# Patient Record
Sex: Female | Born: 1975 | Marital: Single | Smoking: Never smoker
Health system: Southern US, Community
[De-identification: ages and names within clinical notes are randomized; demographics above are authoritative.]

## PROBLEM LIST (undated history)

## (undated) ENCOUNTER — Ambulatory Visit (HOSPITAL_COMMUNITY): Discharge status: Absent without leave | Payer: Self-pay

## (undated) DIAGNOSIS — O039 Complete or unspecified spontaneous abortion without complication: Secondary | ICD-10-CM

## (undated) DIAGNOSIS — I10 Essential (primary) hypertension: Secondary | ICD-10-CM

## (undated) DIAGNOSIS — R569 Unspecified convulsions: Secondary | ICD-10-CM

## (undated) DIAGNOSIS — S42301A Unspecified fracture of shaft of humerus, right arm, initial encounter for closed fracture: Secondary | ICD-10-CM

## (undated) DIAGNOSIS — L0291 Cutaneous abscess, unspecified: Secondary | ICD-10-CM

---

## 2004-07-27 ENCOUNTER — Ambulatory Visit: Payer: Self-pay | Admitting: Internal Medicine

## 2004-09-13 ENCOUNTER — Ambulatory Visit: Payer: Self-pay | Admitting: Internal Medicine

## 2004-09-16 ENCOUNTER — Ambulatory Visit: Payer: Self-pay | Admitting: *Deleted

## 2004-11-11 ENCOUNTER — Ambulatory Visit: Payer: Self-pay | Admitting: Internal Medicine

## 2004-11-11 LAB — CONVERTED CEMR LAB
Cholesterol: 226 mg/dL
HDL: 46 mg/dL
LDL Cholesterol: 140 mg/dL
Pap Smear: NORMAL
Triglycerides: 199 mg/dL

## 2005-05-27 ENCOUNTER — Ambulatory Visit: Payer: Self-pay | Admitting: Internal Medicine

## 2005-07-15 ENCOUNTER — Ambulatory Visit: Payer: Self-pay | Admitting: Internal Medicine

## 2005-09-20 ENCOUNTER — Ambulatory Visit: Payer: Self-pay | Admitting: Family Medicine

## 2006-01-05 ENCOUNTER — Ambulatory Visit: Payer: Self-pay | Admitting: Family Medicine

## 2006-07-13 ENCOUNTER — Ambulatory Visit: Payer: Self-pay | Admitting: Family Medicine

## 2006-08-11 ENCOUNTER — Ambulatory Visit: Payer: Self-pay | Admitting: Internal Medicine

## 2006-10-19 ENCOUNTER — Ambulatory Visit: Payer: Self-pay | Admitting: Internal Medicine

## 2006-10-25 ENCOUNTER — Ambulatory Visit (HOSPITAL_COMMUNITY): Admission: RE | Admit: 2006-10-25 | Discharge: 2006-10-25 | Payer: Self-pay | Admitting: Internal Medicine

## 2006-11-02 ENCOUNTER — Encounter: Payer: Self-pay | Admitting: Internal Medicine

## 2006-11-02 DIAGNOSIS — J309 Allergic rhinitis, unspecified: Secondary | ICD-10-CM | POA: Insufficient documentation

## 2006-11-13 ENCOUNTER — Telehealth (INDEPENDENT_AMBULATORY_CARE_PROVIDER_SITE_OTHER): Payer: Self-pay | Admitting: Internal Medicine

## 2006-11-17 ENCOUNTER — Ambulatory Visit: Payer: Self-pay | Admitting: Internal Medicine

## 2006-12-01 ENCOUNTER — Ambulatory Visit (HOSPITAL_COMMUNITY): Admission: RE | Admit: 2006-12-01 | Discharge: 2006-12-01 | Payer: Self-pay | Admitting: Family Medicine

## 2006-12-06 ENCOUNTER — Encounter (INDEPENDENT_AMBULATORY_CARE_PROVIDER_SITE_OTHER): Payer: Self-pay | Admitting: *Deleted

## 2006-12-12 ENCOUNTER — Telehealth (INDEPENDENT_AMBULATORY_CARE_PROVIDER_SITE_OTHER): Payer: Self-pay | Admitting: *Deleted

## 2006-12-28 ENCOUNTER — Ambulatory Visit: Payer: Self-pay | Admitting: Internal Medicine

## 2006-12-28 DIAGNOSIS — R109 Unspecified abdominal pain: Secondary | ICD-10-CM | POA: Insufficient documentation

## 2006-12-28 DIAGNOSIS — N9489 Other specified conditions associated with female genital organs and menstrual cycle: Secondary | ICD-10-CM | POA: Insufficient documentation

## 2006-12-28 LAB — CONVERTED CEMR LAB: Rapid Strep: NEGATIVE

## 2006-12-29 ENCOUNTER — Encounter (INDEPENDENT_AMBULATORY_CARE_PROVIDER_SITE_OTHER): Payer: Self-pay | Admitting: Internal Medicine

## 2007-04-03 ENCOUNTER — Ambulatory Visit: Payer: Self-pay | Admitting: Nurse Practitioner

## 2007-04-03 LAB — CONVERTED CEMR LAB
Beta hcg, urine, semiquantitative: NEGATIVE
Glucose, Urine, Semiquant: NEGATIVE
Nitrite: POSITIVE
Protein, U semiquant: >=300
Specific Gravity, Urine: 1.015
Urobilinogen, UA: 1
hCG, Beta Chain, Quant, S: <2 milliintl units/mL
pH: 6.5

## 2007-04-06 ENCOUNTER — Ambulatory Visit: Payer: Self-pay | Admitting: Internal Medicine

## 2007-04-06 DIAGNOSIS — N3090 Cystitis, unspecified without hematuria: Secondary | ICD-10-CM | POA: Insufficient documentation

## 2007-04-06 DIAGNOSIS — D259 Leiomyoma of uterus, unspecified: Secondary | ICD-10-CM | POA: Insufficient documentation

## 2007-04-09 ENCOUNTER — Encounter (INDEPENDENT_AMBULATORY_CARE_PROVIDER_SITE_OTHER): Payer: Self-pay | Admitting: Nurse Practitioner

## 2007-04-17 ENCOUNTER — Telehealth (INDEPENDENT_AMBULATORY_CARE_PROVIDER_SITE_OTHER): Payer: Self-pay | Admitting: Internal Medicine

## 2007-10-01 ENCOUNTER — Telehealth (INDEPENDENT_AMBULATORY_CARE_PROVIDER_SITE_OTHER): Payer: Self-pay | Admitting: Internal Medicine

## 2008-04-08 ENCOUNTER — Ambulatory Visit: Payer: Self-pay | Admitting: Internal Medicine

## 2008-04-08 DIAGNOSIS — R5381 Other malaise: Secondary | ICD-10-CM | POA: Insufficient documentation

## 2008-04-08 DIAGNOSIS — M542 Cervicalgia: Secondary | ICD-10-CM | POA: Insufficient documentation

## 2008-04-08 DIAGNOSIS — R5383 Other fatigue: Secondary | ICD-10-CM

## 2008-04-08 DIAGNOSIS — R1314 Dysphagia, pharyngoesophageal phase: Secondary | ICD-10-CM | POA: Insufficient documentation

## 2008-04-11 ENCOUNTER — Ambulatory Visit (HOSPITAL_COMMUNITY): Admission: RE | Admit: 2008-04-11 | Discharge: 2008-04-11 | Payer: Self-pay | Admitting: Internal Medicine

## 2008-04-16 DIAGNOSIS — R7309 Other abnormal glucose: Secondary | ICD-10-CM | POA: Insufficient documentation

## 2008-04-16 LAB — CONVERTED CEMR LAB
ALT: 65 units/L — ABNORMAL HIGH (ref 0–35)
AST: 31 units/L (ref 0–37)
Albumin: 4.4 g/dL (ref 3.5–5.2)
Alkaline Phosphatase: 68 units/L (ref 39–117)
BUN: 10 mg/dL (ref 6–23)
Basophils Absolute: 0 10*3/uL (ref 0.0–0.1)
Basophils Relative: 0 % (ref 0–1)
CO2: 27 meq/L (ref 19–32)
Calcium: 9.2 mg/dL (ref 8.4–10.5)
Chloride: 104 meq/L (ref 96–112)
Creatinine, Ser: 0.54 mg/dL (ref 0.40–1.20)
Eosinophils Absolute: 0.2 10*3/uL (ref 0.0–0.7)
Eosinophils Relative: 2 % (ref 0–5)
Glucose, Bld: 138 mg/dL — ABNORMAL HIGH (ref 70–99)
HCT: 41.8 % (ref 36.0–46.0)
Hemoglobin: 13.9 g/dL (ref 12.0–15.0)
Lymphocytes Relative: 51 % — ABNORMAL HIGH (ref 12–46)
Lymphs Abs: 4.4 10*3/uL — ABNORMAL HIGH (ref 0.7–4.0)
MCHC: 33.3 g/dL (ref 30.0–36.0)
MCV: 89.1 fL (ref 78.0–100.0)
Monocytes Absolute: 0.4 10*3/uL (ref 0.1–1.0)
Monocytes Relative: 5 % (ref 3–12)
Neutro Abs: 3.7 10*3/uL (ref 1.7–7.7)
Neutrophils Relative %: 42 % — ABNORMAL LOW (ref 43–77)
Platelets: 271 10*3/uL (ref 150–400)
Potassium: 4.3 meq/L (ref 3.5–5.3)
RBC: 4.69 M/uL (ref 3.87–5.11)
RDW: 12.9 % (ref 11.5–15.5)
Sodium: 141 meq/L (ref 135–145)
TSH: 4.422 microintl units/mL (ref 0.350–4.50)
Total Bilirubin: 0.3 mg/dL (ref 0.3–1.2)
Total Protein: 7.5 g/dL (ref 6.0–8.3)
WBC: 8.7 10*3/uL (ref 4.0–10.5)

## 2008-04-24 ENCOUNTER — Ambulatory Visit: Payer: Self-pay | Admitting: Internal Medicine

## 2008-04-24 LAB — CONVERTED CEMR LAB: Blood Glucose, AC Bkfst: 119 mg/dL

## 2008-05-05 LAB — CONVERTED CEMR LAB
HCV Ab: NEGATIVE
Hep A Total Ab: POSITIVE — AB
Hep B Core Total Ab: NEGATIVE
Hep B E Ab: NEGATIVE
Hep B S Ab: NEGATIVE
Hepatitis B Surface Ag: NEGATIVE

## 2008-05-08 ENCOUNTER — Ambulatory Visit: Payer: Self-pay | Admitting: Internal Medicine

## 2008-05-19 LAB — CONVERTED CEMR LAB: Hep A IgM: NEGATIVE

## 2008-06-05 ENCOUNTER — Ambulatory Visit: Payer: Self-pay | Admitting: Internal Medicine

## 2008-06-05 DIAGNOSIS — G5600 Carpal tunnel syndrome, unspecified upper limb: Secondary | ICD-10-CM | POA: Insufficient documentation

## 2008-06-05 DIAGNOSIS — E669 Obesity, unspecified: Secondary | ICD-10-CM | POA: Insufficient documentation

## 2008-06-06 ENCOUNTER — Encounter (INDEPENDENT_AMBULATORY_CARE_PROVIDER_SITE_OTHER): Payer: Self-pay | Admitting: Internal Medicine

## 2008-06-13 ENCOUNTER — Ambulatory Visit (HOSPITAL_COMMUNITY): Admission: RE | Admit: 2008-06-13 | Discharge: 2008-06-13 | Payer: Self-pay | Admitting: Internal Medicine

## 2008-06-17 ENCOUNTER — Encounter (INDEPENDENT_AMBULATORY_CARE_PROVIDER_SITE_OTHER): Payer: Self-pay | Admitting: Internal Medicine

## 2008-06-18 LAB — CONVERTED CEMR LAB
ALT: 41 units/L — ABNORMAL HIGH (ref 0–35)
AST: 26 units/L (ref 0–37)
Albumin: 4.6 g/dL (ref 3.5–5.2)
Alkaline Phosphatase: 68 units/L (ref 39–117)
Bilirubin, Direct: <0.1 mg/dL (ref 0.0–0.3)
TSH: 4.485 microintl units/mL (ref 0.350–4.500)
Total Bilirubin: 0.3 mg/dL (ref 0.3–1.2)
Total Protein: 7.5 g/dL (ref 6.0–8.3)

## 2008-08-07 ENCOUNTER — Ambulatory Visit: Payer: Self-pay | Admitting: Internal Medicine

## 2008-08-19 LAB — CONVERTED CEMR LAB
ALT: 44 units/L — ABNORMAL HIGH (ref 0–35)
AST: 26 units/L (ref 0–37)
Albumin: 4.3 g/dL (ref 3.5–5.2)
Alkaline Phosphatase: 64 units/L (ref 39–117)
Bilirubin, Direct: 0.1 mg/dL (ref 0.0–0.3)
Indirect Bilirubin: 0.2 mg/dL (ref 0.0–0.9)
Total Bilirubin: 0.3 mg/dL (ref 0.3–1.2)
Total Protein: 7.8 g/dL (ref 6.0–8.3)

## 2008-12-12 ENCOUNTER — Emergency Department (HOSPITAL_COMMUNITY): Admission: EM | Admit: 2008-12-12 | Discharge: 2008-12-12 | Payer: Self-pay | Admitting: Emergency Medicine

## 2008-12-12 ENCOUNTER — Telehealth (INDEPENDENT_AMBULATORY_CARE_PROVIDER_SITE_OTHER): Payer: Self-pay | Admitting: Internal Medicine

## 2009-01-15 ENCOUNTER — Encounter (INDEPENDENT_AMBULATORY_CARE_PROVIDER_SITE_OTHER): Payer: Self-pay | Admitting: Internal Medicine

## 2009-01-15 ENCOUNTER — Ambulatory Visit: Payer: Self-pay | Admitting: Internal Medicine

## 2009-01-15 DIAGNOSIS — N200 Calculus of kidney: Secondary | ICD-10-CM | POA: Insufficient documentation

## 2009-01-15 DIAGNOSIS — N915 Oligomenorrhea, unspecified: Secondary | ICD-10-CM | POA: Insufficient documentation

## 2009-01-15 DIAGNOSIS — E7800 Pure hypercholesterolemia, unspecified: Secondary | ICD-10-CM | POA: Insufficient documentation

## 2009-01-15 DIAGNOSIS — Q828 Other specified congenital malformations of skin: Secondary | ICD-10-CM | POA: Insufficient documentation

## 2009-01-15 LAB — CONVERTED CEMR LAB
Bilirubin Urine: NEGATIVE
Blood in Urine, dipstick: NEGATIVE
Chlamydia, DNA Probe: NEGATIVE
GC Probe Amp, Genital: NEGATIVE
Glucose, Urine, Semiquant: NEGATIVE
KOH Prep: NEGATIVE
Ketones, urine, test strip: NEGATIVE
Nitrite: NEGATIVE
Protein, U semiquant: NEGATIVE
Specific Gravity, Urine: 1.01
Urobilinogen, UA: NEGATIVE
WBC Urine, dipstick: NEGATIVE
Whiff Test: POSITIVE
pH: 7

## 2009-01-16 ENCOUNTER — Ambulatory Visit: Payer: Self-pay | Admitting: Internal Medicine

## 2009-01-16 LAB — CONVERTED CEMR LAB: Rapid HIV Screen: NEGATIVE

## 2009-01-22 ENCOUNTER — Encounter (INDEPENDENT_AMBULATORY_CARE_PROVIDER_SITE_OTHER): Payer: Self-pay | Admitting: Internal Medicine

## 2009-01-22 DIAGNOSIS — R8789 Other abnormal findings in specimens from female genital organs: Secondary | ICD-10-CM | POA: Insufficient documentation

## 2009-01-23 LAB — CONVERTED CEMR LAB
ALT: 85 units/L — ABNORMAL HIGH (ref 0–35)
AST: 44 units/L — ABNORMAL HIGH (ref 0–37)
Albumin: 4.3 g/dL (ref 3.5–5.2)
Alkaline Phosphatase: 62 units/L (ref 39–117)
BUN: 10 mg/dL (ref 6–23)
Basophils Absolute: 0 10*3/uL (ref 0.0–0.1)
Basophils Relative: 0 % (ref 0–1)
CO2: 19 meq/L (ref 19–32)
Calcium: 8.9 mg/dL (ref 8.4–10.5)
Chloride: 104 meq/L (ref 96–112)
Cholesterol: 225 mg/dL — ABNORMAL HIGH (ref 0–200)
Creatinine, Ser: 0.53 mg/dL (ref 0.40–1.20)
DHEA-SO4: 110 ug/dL (ref 35–430)
Eosinophils Absolute: 0.1 10*3/uL (ref 0.0–0.7)
Eosinophils Relative: 1 % (ref 0–5)
Glucose, Bld: 106 mg/dL — ABNORMAL HIGH (ref 70–99)
HCT: 43.7 % (ref 36.0–46.0)
HDL: 42 mg/dL (ref >39)
Hemoglobin: 13.8 g/dL (ref 12.0–15.0)
LDL Cholesterol: 141 mg/dL — ABNORMAL HIGH (ref 0–99)
Lymphocytes Relative: 32 % (ref 12–46)
Lymphs Abs: 2.9 10*3/uL (ref 0.7–4.0)
MCHC: 31.6 g/dL (ref 30.0–36.0)
MCV: 91.4 fL (ref 78.0–100.0)
Monocytes Absolute: 0.5 10*3/uL (ref 0.1–1.0)
Monocytes Relative: 6 % (ref 3–12)
Neutro Abs: 5.5 10*3/uL (ref 1.7–7.7)
Neutrophils Relative %: 61 % (ref 43–77)
Platelets: 255 10*3/uL (ref 150–400)
Potassium: 4.7 meq/L (ref 3.5–5.3)
RBC: 4.78 M/uL (ref 3.87–5.11)
RDW: 13.1 % (ref 11.5–15.5)
Sodium: 138 meq/L (ref 135–145)
TSH: 6.129 microintl units/mL — ABNORMAL HIGH (ref 0.350–4.500)
Testosterone: 27.24 ng/dL (ref 10–70)
Total Bilirubin: 0.5 mg/dL (ref 0.3–1.2)
Total CHOL/HDL Ratio: 5.4
Total Protein: 7.6 g/dL (ref 6.0–8.3)
Triglycerides: 210 mg/dL — ABNORMAL HIGH (ref <150)
VLDL: 42 mg/dL — ABNORMAL HIGH (ref 0–40)
WBC: 9 10*3/uL (ref 4.0–10.5)

## 2009-02-20 ENCOUNTER — Ambulatory Visit: Payer: Self-pay | Admitting: Internal Medicine

## 2009-02-25 ENCOUNTER — Encounter (INDEPENDENT_AMBULATORY_CARE_PROVIDER_SITE_OTHER): Payer: Self-pay | Admitting: Internal Medicine

## 2009-03-25 ENCOUNTER — Ambulatory Visit: Payer: Self-pay | Admitting: Family Medicine

## 2009-03-25 ENCOUNTER — Other Ambulatory Visit: Admission: RE | Admit: 2009-03-25 | Discharge: 2009-03-25 | Payer: Self-pay | Admitting: Family Medicine

## 2009-04-29 ENCOUNTER — Ambulatory Visit: Payer: Self-pay | Admitting: Obstetrics & Gynecology

## 2010-04-11 ENCOUNTER — Encounter: Payer: Self-pay | Admitting: Internal Medicine

## 2010-04-20 NOTE — Progress Notes (Signed)
Summary: TEST RESULTS/  Phone Note Call from Patient Call back at Home Phone [phone]   Caller: Patient Reason for Call: Lab or Test Results Summary of Call: THE PT IS REQUESTING TEST RESULTS/ PT WANTS TO COME TOMORROW MORNING Initial call taken by: Manon HildingGraciela Kellar,  November 13, 2006 4:33 PM  Follow-up for Phone Call        Ashby DawesGraciela to call pt to make appt for f/u on xray results Follow-up by: Vesta Mixeriffany McCoy,  November 13, 2006 5:01 PM

## 2010-04-20 NOTE — Letter (Signed)
Summary: *Referral Letter  [facility]  [address]   [place], [place] [address]   Phone: [phone]  Fax: [phone]    01/22/2009  Thank you in advance for agreeing to see my patient:  [facility] [address] [place], [place]  [address]  Phone: [phone]  Reason for Referral: LGSIL on recent pap smear with grossly abnormal appearance of cervix on exam--almost appeared as a strawberry cervix--scattered small areas of inflammation  Procedures Requested:   Current Medical Problems: 1)  KERATOSIS PILARIS (ICD-757.39) 2)  HYPERCHOLESTEROLEMIA (ICD-272.0) 3)  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) 4)  ROUTINE GYNECOLOGICAL EXAMINATION (ICD-V72.31) 5)  BACTERIAL VAGINOSIS (ICD-616.10) 6)  RENAL CALCULUS, LEFT (ICD-592.0) 7)  CARPAL TUNNEL SYNDROME, RIGHT (ICD-354.0) 8)  OBESITY (ICD-278.00) 9)  ELEVATED LIVER ENZYMES (ICD-790.5) 10)  HYPERGLYCEMIA (ICD-790.29) 11)  FATIGUE (ICD-780.79) 12)  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0) 3)  OBESITY (ICD-278.00) 4)  ELEVATED LIVER ENZYMES (ICD-790.5) 5)  HYPERGLYCEMIA (ICD-790.29) 6)  FATIGUE (ICD-780.79) 7)  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  Sincerely,  Julieanne MansonElizabeth Choice Kleinsasser MD

## 2010-04-20 NOTE — Letter (Signed)
Summary: REFERRAL/RADIOLOGY/APPT DATE &TIME  REFERRAL/RADIOLOGY/APPT DATE &TIME   Imported By: Arta BruceShelia Stanislawscyk 06/17/2008 10:00:10  _____________________________________________________________________  External Attachment:    Type:   Image     Comment:   External Document

## 2010-04-20 NOTE — Assessment & Plan Note (Signed)
Summary: Cystitis/Fibroid    Vital Signs:  Patient Profile:   35 Years Old Female Weight:      165 pounds Temp:     97.9 degrees F Pulse rate:   76 / minute Pulse rhythm:   regular Resp:     18 per minute BP sitting:   122 / 80  (left arm) Cuff size:   regular  Pt. in pain?   no  Vitals Entered By: Vesta Mixeriffany McCoy CMA (April 06, 2007 8:23 AM)                  Chief Complaint:  f/u for test results.  History of Present Illness:  pt into the office for follow up from earlier this week. Her cycle ended on 03/21/07.  Noted the cycle returned 8 days ago.   Notes that flow was very heavy when it came on so soon.  She notes that she has lots of pain in her abd and back.  She notes that she does not normally have normal cycles.  However this time it was really close together. Reviewed wtih pt that she did have an ultrasound done back in september that indicated that she needed a repeat ultrasound in 6 weeks.  Pt notes that she did not have a car so she was not able to go to f/u appt. She notes that she still has pain in left side that is especially painful when her cycle starts.   She did get the cipro as ordered on her nurse visit here in office and reports that abd pain has improved.  Current Allergies: No known allergies      Review of Systems  General      Denies chills, fatigue, and fever.  ENT      Denies earache and sore throat.  CV      Denies chest pain or discomfort, fatigue, and shortness of breath with exertion.  Resp      Denies cough and shortness of breath.  GI      Complains of abdominal pain.      Denies nausea and vomiting.      improving  GU      Denies abnormal vaginal bleeding.   Physical Exam  General:     alert.   Head:     normocephalic.   Eyes:     pupils round.   Ears:     R ear normal and L ear normal.   Nose:     no external deformity.   Mouth:     fair dentition.   Lungs:     normal respiratory effort, no intercostal  retractions, no accessory muscle use, and normal breath sounds.   Heart:     normal rate, regular rhythm, and no murmur.   Abdomen:     obese nontender at this time Bs x 4 Msk:     up to exam table Extremities:     no edema Neurologic:     alert & oriented X3.   Skin:     color normal.   Psych:     Oriented X3.      Impression & Recommendations:  Problem # 1:  CYSTITIS (ICD-595.9) Pt to complete the cipro as ordered earlier this week Her updated medication list for this problem includes:    Cipro 500 Mg Tabs (Ciprofloxacin hcl) .Marland Kitchen.... 1 tablet by mouth two times a day for infection   Problem # 2:  FIBROIDS, UTERUS (ICD-218.9) Explained to pt  that she does have this dx.  Again reviewed that this is probably why her cycles are irregular and flow is heavy Orders: Ultrasound (Ultrasound)   Problem # 3:  OVARIAN MASS (ICD-625.8) Pt was to have repeat ultrasound to evaluate mass 6 weeks after original done 12-01-06.  Will ordere today. Orders: Ultrasound (Ultrasound)   Complete Medication List: 1)  Allegra 180 Mg Tabs (Fexofenadine hcl) .Marland Kitchen.. 1 by mouth once daily 2)  Nasonex 50 Mcg/act Susp (Mometasone furoate) .... 2 sprays each nostril daily 3)  Singulair 10 Mg Tabs (Montelukast sodium) .Marland Kitchen.. 1 tab by mouth daily 4)  Cipro 500 Mg Tabs (Ciprofloxacin hcl) .Marland Kitchen.. 1 tablet by mouth two times a day for infection   Patient Instructions: 1)  Get ultrasound as ordered to evaluate fibroids and right ovarian mass. 2)  Follow up in this office 2 weeks after ultrasound for results.    ]

## 2010-04-20 NOTE — Letter (Signed)
Summary: REFERRAL/ RADIOLOGY/APPT TIME  REFERRAL/ RADIOLOGY/APPT TIME   Imported By: Arta BruceShelia Stanislawscyk 04/08/2008 15:45:57  _____________________________________________________________________  External Attachment:    Type:   Image     Comment:   External Document

## 2010-04-20 NOTE — Assessment & Plan Note (Signed)
Summary: cpp exam//gk   Vital Signs:  Patient profile:   35 year old female LMP:     11/02/2008 Weight:      164 pounds Temp:     98.1 degrees F Pulse rate:   80 / minute Pulse rhythm:   regular Resp:     18 per minute BP sitting:   121 / 84  (left arm) Cuff size:   regular  Vitals Entered By: Vesta Mixer CMA (January 15, 2009 2:39 PM) CC: CPP Is Patient Diabetic? No Pain Assessment Patient in pain? no       Does patient need assistance? Ambulation Normal LMP (date): 11/02/2008 LMP - Character: 03/21/07 then came back on 03/24/07     Enter LMP: 11/02/2008 Last PAP Result Normal   CC:  CPP.  History of Present Illness: 35 yo female here for CPP.  Concerns:  1.  Kidney stone:  was in ED 12/12/08 per pt. for this.  Has not had the pain since just after the ED visit.  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  2001:  Csection  Family History: Mother, 52:  GERD,  Father, 76:  Hypertension 4 Sisters:  older sister with migraines--rest healthy 1 daughter, 44: asthma  Social History: Originally from [place]  In [employer]. for 6 years. 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  Fatigued with how much  she works.  Does not feel depressed.. Eyes:  Denies blurring. ENT:  Denies decreased hearing. CV:  Denies chest pain or discomfort and palpitations. Resp:  Denies shortness of breath. GI:  Denies bloody stools, constipation, dark tarry stools, and diarrhea; When eats a lot--abdominal pain. GU:  periods never regular has not had a period in 2-3 months. MS:  Denies joint pain, joint redness, and joint swelling. Derm:  Denies lesion(s); papules on upper arms. Neuro:  Denies numbness, tingling, and weakness. Psych:  Denies anxiety and depression.  Physical Exam  General:  Well-developed,well-nourished,in no acute distress; alert,appropriate and cooperative throughout examination Head:  Normocephalic and atraumatic without obvious abnormalities. No apparent alopecia or balding. Eyes:  No corneal or conjunctival inflammation noted. EOMI. Perrla. Funduscopic exam benign, without hemorrhages, exudates or papilledema. Vision grossly normal. Ears:  External ear exam shows no significant lesions or deformities.  Otoscopic examination reveals clear canals, tympanic membranes are intact bilaterally without bulging, retraction, inflammation or discharge. Hearing is grossly normal bilaterally. Nose:  External nasal examination shows no deformity or inflammation. Nasal mucosa are pink and moist without lesions or exudates. Mouth:  Oral mucosa and oropharynx without lesions or exudates.  Teeth in good repair. Neck:  No deformities, masses, or tenderness noted. Breasts:  No mass, nodules, thickening, tenderness, bulging, retraction, inflamation, nipple discharge or skin changes noted.   Lungs:  Normal respiratory effort, chest expands symmetrically. Lungs are clear to auscultation, no crackles or wheezes. Heart:  Normal rate and  regular rhythm. S1 and S2 normal without gallop, murmur, click, rub or other extra sounds. Abdomen:  Bowel sounds positive,abdomen soft and non-tender without masses, organomegaly or  hernias noted. Rectal:  deferred Genitalia:  Pelvic Exam:        External: normal female genitalia without lesions or masses        Vagina: normal without lesions or masses        Cervix: almost strawberry appearing cervix with scattered inflammation        Adnexa: normal bimanual exam without masses or fullness        Uterus: normal by palpation        Pap smear: performed Msk:  No deformity or scoliosis noted of thoracic or lumbar spine.   Pulses:  R and L carotid,radial,femoral,dorsalis pedis and posterior tibial pulses are full and equal bilaterally Extremities:  No clubbing, cyanosis, edema, or deformity noted with normal full range of motion of all joints.   Neurologic:  No cranial nerve deficits noted. Station and gait are normal. Plantar reflexes are down-going bilaterally. DTRs are symmetrical throughout. Sensory, motor and coordinative functions appear intact. Skin:  small papular lesions most pronounced on upper lateral arms--some on erythematous base Cervical Nodes:  No lymphadenopathy noted Axillary Nodes:  No palpable lymphadenopathy Inguinal Nodes:  No significant adenopathy Psych:  Cognition and judgment appear intact. Alert and cooperative with normal attention span and concentration. No apparent delusions, illusions, hallucinations   Impression & Recommendations:  Problem # 1:  ROUTINE GYNECOLOGICAL EXAMINATION (ICD-V72.31)  Orders: Pap Smear, Thin Prep ( Collection of) [phone]) KOH/ WET Mount [phone]) T- GC Chlamydia (56213) T-Pap Smear, Thin Prep (08657)  Problem # 2:  KERATOSIS PILARIS (ICD-757.39) Eucerin cream  Problem # 3:  BACTERIAL VAGINOSIS (ICD-616.10) Await pap smear-no trichomonas on wet prep-but still concerned with appearance of cervix. Her updated medication list for this problem includes:    Metronidazole 500 Mg Tabs (Metronidazole) .Marland Kitchen... 1 tab by mouth two times a day for 7 days  Problem # 4:  Preventive Health Care (ICD-V70.0) Tdap today Flu  shot.  Problem # 5:  RENAL CALCULUS, LEFT (ICD-592.0) Check ED chart  Complete Medication List: 1)  Singulair 10 Mg Tabs (Montelukast sodium) .Marland Kitchen.. 1 tab by mouth daily 2)  Claritin 10 Mg Tabs (Loratadine) .... 1/2 tab by mouth in morning 3)  Aciphex 20 Mg Tbec (Rabeprazole sodium) .Marland Kitchen.. 1 tab by mouth on empty stomach in morning 4)  Metronidazole 500 Mg Tabs (Metronidazole) .Marland Kitchen.. 1 tab by mouth two times a day for 7 days  Other Orders: Nutrition Referral (Nutrition) Tdap => 72yrs IM (84696) Admin 1st Vaccine (29528) Admin 1st Vaccine [facility]) [phone]) Flu Vaccine 43yrs + (01027) Admin of Any Addtl Vaccine (25366) Admin of Any Addtl Vaccine (State) (44034V)  Patient Instructions: 1)  Eucerin cream for arms after shower everyday. 2)  Fasting labs tomorrow if possible--FLP, CBC, CmET, RPR, HIV, testosterone, DHEAS, TSH--272.0, fatigue, v72.3, oligomenorrhea 3)  Calcium 500-600 mg two times a day with Vitamin D 200 International Units two times a day --can get both in one pill 4)  Exercise!!!  Preventive Care Screening  Prior Values:    Pap Smear:  Normal (11/11/2004)     SBE:  No findings--does perform at least monthly. Mammogram:  Never. Osteoprevention:  Not much in way of dairy.  No exercise. Immunizations:  has not had Td in some time.  No flu or pneumovax in past.  Prescriptions: METRONIDAZOLE 500 MG TABS (METRONIDAZOLE) 1 tab by  mouth two times a day for 7 days  #14 x 0   Entered and Authorized by:   Julieanne MansonElizabeth Conchita Truxillo MD   Signed by:   Julieanne MansonElizabeth Dynastie Knoop MD on 01/15/2009   Method used:   Faxed to ...       [facility] (retail)       [address]       [place], [place]  [address]       Ph: [phone]       Fax: [phone]   RxID:   [id number]    Tetanus/Td Vaccine    Vaccine Type: Tdap    Site: right deltoid    Mfr: Sanofi Pasteur    Dose: 0.5 ml    Route: IM    Given by: Vesta Mixeriffany McCoy CMA    Exp. Date:  09/25/2010    Lot #: [id number]    VIS given: 02/06/07 version given January 15, 2009.  Influenza Vaccine    Vaccine Type: Fluvax 3+    Site: left deltoid    Mfr: Sanofi Pasteur    Dose: 0.5 ml    Route: IM    Given by: Vesta Mixeriffany McCoy CMA    Exp. Date: 09/17/2009    Lot #: [id number]    VIS given: 10/12/06 version given January 15, 2009.   Laboratory Results   Urine Tests    Routine Urinalysis   Glucose: negative   (Normal Range: Negative) Bilirubin: negative   (Normal Range: Negative) Ketone: negative   (Normal Range: Negative) Spec. Gravity: 1.010   (Normal Range: 1.003-1.035) Blood: negative   (Normal Range: Negative) pH: 7.0   (Normal Range: 5.0-8.0) Protein: negative   (Normal Range: Negative) Urobilinogen: negative   (Normal Range: 0-1) Nitrite: negative   (Normal Range: Negative) Leukocyte Esterace: negative   (Normal Range: Negative)      Wet Mount/KOH Source: vaginal WBC/hpf: 1-5 Bacteria/hpf: 2+ Clue cells/hpf: few  Positive whiff Yeast/hpf: none Trichomonas/hpf: none   Laboratory Results   Urine Tests    Routine Urinalysis   Glucose: negative   (Normal Range: Negative) Bilirubin: negative   (Normal Range: Negative) Ketone: negative   (Normal Range: Negative) Spec. Gravity: 1.010   (Normal Range: 1.003-1.035) Blood: negative   (Normal Range: Negative) pH: 7.0   (Normal Range: 5.0-8.0) Protein: negative   (Normal Range: Negative) Urobilinogen: negative   (Normal Range: 0-1) Nitrite: negative   (Normal Range: Negative) Leukocyte Esterace: negative   (Normal Range: Negative)      Wet Mount  Positive whiff Wet Mount KOH: Negative

## 2010-04-20 NOTE — Assessment & Plan Note (Signed)
  Medications Added CIPRO 500 MG  TABS (CIPROFLOXACIN HCL) 1 tablet by mouth two times a day for infection      Triage/Nurse Visit  Vitals Entered By: Mikey Collegeegina Poe CMA (April 03, 2007 10:55 AM)  Menstrual History: LMP - Character: 03/21/07 then came back on 03/24/07             Is Patient Diabetic? No  Does patient need assistance? Functional Status Self care Ambulation Normal Comments Triage/Nurse Visit.     Chief Complaint:  Pt c/o abd pains and back pain. Pt states that last period was 03/21/07 and started again 03/24/07(Saturday). Pt states color was red instead black looking . Left ovary pain for 2days but on yesterday there was no pain. pt worked for [employer]15hrs and only used 1 pad that day. pt stated last night went to bathroom and passed a large amount of blood and was dizzy. This was about 11:40pm after work..  Current Allergies: No known allergies         Complete Medication List: 1)  Allegra 180 Mg Tabs (Fexofenadine hcl) .Marland Kitchen... 1 by mouth once daily 2)  Nasonex 50 Mcg/act Susp (Mometasone furoate) .... 2 sprays each nostril daily 3)  Singulair 10 Mg Tabs (Montelukast sodium) .Marland Kitchen... 1 tab by mouth daily 4)  Cipro 500 Mg Tabs (Ciprofloxacin hcl) .Marland Kitchen... 1 tablet by mouth two times a day for infection   Patient Instructions: 1)  drink plenty of water 2)  schedule regular bathroom breaks while working.  do not hold your urine    Prescriptions: CIPRO 500 MG  TABS (CIPROFLOXACIN HCL) 1 tablet by mouth two times a day for infection  #10 x 0   Entered by:   Mikey Collegeegina Poe CMA   Authorized by:   Lehman PromNykedtra Martin FNP   Signed by:   Mikey Collegeegina Poe CMA on 04/03/2007   Method used:   Print then Give to Patient   RxID:   [id number]  ] Laboratory Results   Urine Tests  Date/Time Received: April 03, 2007 10:56 AM Date/Time Reported: April 03, 2007 10:56 AM  Routine Urinalysis   Color: red Appearance: Hazy Glucose: negative   (Normal Range: Negative) Bilirubin: large    (Normal Range: Negative) Ketone: smal (15)   (Normal Range: Negative) Spec. Gravity: 1.015   (Normal Range: 1.003-1.035) Blood: large   (Normal Range: Negative) pH: 6.5   (Normal Range: 5.0-8.0) Protein: >=300   (Normal Range: Negative) Urobilinogen: 1.0   (Normal Range: 0-1) Nitrite: positive   (Normal Range: Negative) Leukocyte Esterace: large   (Normal Range: Negative)    Urine HCG: negative     Appended Document: added visit number

## 2010-04-20 NOTE — Letter (Signed)
Summary: REFERR[facility]  REFERRAL[facility]   Imported By: Arta Bruce 04/13/2009 16:11:53  _____________________________________________________________________  External Attachment:    Type:   Image     Comment:   External Document
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
## 2010-04-20 NOTE — Miscellaneous (Signed)
Summary: VIP  Patient: Annette Wolfe Note: All result statuses are Final unless otherwise noted.  Tests: (1) VIP (Medications)   LLIMPORTMEDS              "Result Below..."       RESULT: NASACORT AQ AERS 55 MCG/ACT*DOS INHALACIONES EN CADA FOSA NASAL UNA VEZ AL DIA (2 SPRAYS IN EACH NOSTRIL ONCE DAILY).*07/13/2006*Last Refill: 11/22/2006*[id number]*******   LLIMPORTMEDS              "Result Below..."       RESULT: DIFLUCAN TABS 100 MG*TOMAR UNA PASTILLA Y MEDIA (150MG )*08/11/2006*Last Refill: [email or web address]*[id number]*******   LLIMPORTMEDS              "Result Below..."       RESULT: DICLOFENAC SODIUM TBEC 75 MG*TOMAR UNA PASTILLA DOS VECES AL DIA SI LO NECESITA (TAKE ONE TABLET TWICE A DAY AS NEEDED).*11/23/2006*Last Refill: [email or web address]*[id number]*******   LLIMPORTMEDS              "Result Below..."       RESULT: ALLEGRA TABS 180 MG*TOMAR UNA TABLETA DIARIA (TAKE ONE TABLET ONCE A DAY)*11/23/2006*Last Refill: [email or web address]*[id number]*******   LLIMPORTALLS              NKDA***  Note: An exclamation mark (!) indicates a result that was not dispersed into the flowsheet. Document Creation Date: 01/18/2007 3:04 PM _______________________________________________________________________  (1) Order result status: Final Collection or observation date-time: 12/06/2006 Requested date-time: 12/06/2006 Receipt date-time:  Reported date-time: 12/06/2006 Referring Physician:   Ordering Physician:   Specimen Source:  Source: [facility] Order Number:  Lab site:

## 2010-04-20 NOTE — Progress Notes (Signed)
Summary: ofice visit/  ultrasound results/  [facility] [phone]   Summary of Call: The pt wants to come for ultrasound results. Initial call taken by: Manon HildingGraciela Kellar,  December 12, 2006 12:25 PM  Follow-up for Phone Call        OK to [facility] for patient [facility] Follow-up by: Vesta Mixeriffany McCoy CMA,  December 15, 2006 11:22 AM  Additional Follow-up for Phone Call Additional follow up Details #1::        She will come on Oct 9 at 11:30 am Additional Follow-up by: Manon HildingGraciela Kellar,  December 15, 2006 2:46 PM

## 2010-04-20 NOTE — Progress Notes (Signed)
Summary: Sore Throat & headache   Phone Note Call from Patient   Caller: Patient Complaint: Cough/Sore throat Summary of Call: Pt is being having Sore Throat & Headache since 3 weeks and the next available appt that Dr Delrae AlfredMulberry has is 8-20 . Please, call her at  [phone]  . Thank You . Pt speak Spanish . Initial call taken by: Cheryll DessertNora Soler,  October 01, 2007 4:10 PM  Follow-up for Phone Call        Spoke with pt she took some ibuprofen and is feeling better.  Is having slight cough still.  I advised her to drink plenty of liquids and try Delysm otc and let us know next week if not better completly. Follow-up by: Vesta Mixeriffany McCoy CMA,  October 10, 2007 12:25 PM

## 2010-04-20 NOTE — Progress Notes (Signed)
 ----   Converted from flag ---- ---- 04/11/2007 2:25 PM, Leodis RainsKimberly Tinnin wrote: Duke SalviaBrenda Maunawili [facility] say that Ms. Soto no showed for radiology yesterday. She says maybe it was the weather. ------------------------------  Phone Note Other Incoming   Summary of Call: phone call from radiology--find out why no showed and see if can get rescheduled. Initial call taken by: Julieanne MansonElizabeth Tramel Westbrook MD,  April 17, 2007 5:22 PM  Follow-up for Phone Call        Ashby DawesGraciela, Can you check with pt to see why she missed her appt at radiology.  Thanks Follow-up by: Vesta Mixeriffany McCoy CMA,  April 18, 2007 9:40 AM  Additional Follow-up for Phone Call Additional follow up Details #1::        left message Additional Follow-up by: Manon HildingGraciela Kellar,  April 18, 2007 11:54 AM    Additional Follow-up for Phone Call Additional follow up Details #2::    The patient couldn't go to her radiology office visit because was on the snow day 05/11/07 and it was hard for her to drive around.  The patient will call me back again on Friday and let me know the best time for ther to go.  Follow-up by: Manon HildingGraciela Kellar,  April 18, 2007 4:05 PM  Additional Follow-up for Phone Call Additional follow up Details #3:: Details for Additional Follow-up Action Taken: Thanks, Will let Dr. Delrae AlfredMulberry know. Additional Follow-up by: Vesta Mixeriffany McCoy CMA,  April 19, 2007 1:09 PM

## 2010-04-20 NOTE — Miscellaneous (Signed)
Summary: Ultrasound order   Clinical Lists Changes  Orders: Added new Test order of Ultrasound (Ultrasound) - Signed

## 2010-04-20 NOTE — Letter (Signed)
Summary: REFERRAL/PHYSICAL THERAPY  REFERRAL/PHYSICAL THERAPY   Imported By: Arta BruceShelia Stanislawscyk 04/09/2008 08:48:42  _____________________________________________________________________  External Attachment:    Type:   Image     Comment:   External Document

## 2010-04-20 NOTE — Progress Notes (Signed)
Summary: ? about meds   Phone Note [facility] [phone]   Summary of Call: Early morning today, the pt went to ER because she has a terrible pain in the left side above her ovary and the provider at there prescribed her morphine and oxycodonine  for the pain.  In the ER, they found a stone in her kidney.  Pt is a little bit concern because in the past she had liver problems and she wants to make sure if these medication can affect her health.  Please call her back [phone] [facility]   Initial call taken by: Manon HildingGraciela Kellar,  December 12, 2008 3:00 PM  Follow-up for Phone Call        Will pull ED records for your review. Follow-up by: Vesta Mixeriffany McCoy CMA,  December 12, 2008 3:08 PM  Additional Follow-up for Phone Call Additional follow up Details #1::        Per Dr. Delrae AlfredMulberry ok to take meds from ED.  Pt notified. Additional Follow-up by: Vesta Mixeriffany McCoy CMA,  December 12, 2008 5:05 PM

## 2010-04-20 NOTE — Letter (Signed)
Summary: REFERRAL/NUTRITION   REFERRAL/NUTRITION   Imported By: Arta BruceShelia Stanislawscyk 06/16/2008 11:35:33  _____________________________________________________________________  External Attachment:    Type:   Image     Comment:   External Document

## 2010-04-20 NOTE — Assessment & Plan Note (Signed)
Summary: per nurse/ ultrasound results//gk   Vital Signs:  Patient Profile:   35 Years Old Female LMP:     12/28/2006 Weight:      165 pounds Temp:     98 degrees F Pulse rate:   20 / minute Pulse rhythm:   regular Resp:     88 per minute BP sitting:   122 / 78  (left arm) Cuff size:   regular  Pt. in pain?   yes    Location:   throat    Intensity:   6  Vitals Entered By: Vesta Mixer CMA (December 28, 2006 11:32 AM)  Menstrual History: LMP (date): 12/28/2006              Is Patient Diabetic? No      Chief Complaint:  sore throat and ultra sound results.  History of Present Illness: 1.  Follow up right adnexal abnormality on pelvic CT.  Pelvic ultrasound confirms an abnormality described as a nodule, but no further characterization.  Pt. does not have any pain on right--only left pelvic area with her periods.  She does also have a uterine fibroid noted on both studies.  Is only taking 2 Advil  three times daily with the pain.  No heavy menses.  2.  Sore throat:    Has been going on since July.  Has been treated with Amoxicillin for sinusitis and recently treated for allergies  8/08  Nasocort and Loratidine .  Pt. states she continues to have as bad a headache and sore throat as previous.  Does have posterior pharyngeal drainage, throat itches, no sneezing or cough.  Has this every year during warm months, then goes away during cold months.  Has had past 3 years.   Using Nasocort appropriately.  Claritin, the generic of which she is currently taking has worked in past.  Current Allergies (reviewed today): No known allergies       Physical Exam  General:     NAD Head:     Normocephalic and atraumatic without obvious abnormalities. No apparent alopecia or balding. Eyes:     No corneal or conjunctival inflammation noted. EOMI. Perrla. Funduscopic exam benign, without hemorrhages, exudates or papilledema. Vision grossly normal. Ears:     External ear exam shows no  significant lesions or deformities.  Otoscopic examination reveals clear canals, tympanic membranes are intact bilaterally without bulging, retraction, inflammation or discharge. Hearing is grossly normal bilaterally. Nose:     Nasal mucosa erythematous with swollen turbinates and clear discharge Mouth:     pharynx pink and moist.   Neck:     Shotty ant cervical nodes--mildly tender. Lungs:     Normal respiratory effort, chest expands symmetrically. Lungs are clear to auscultation, no crackles or wheezes. Heart:     Normal rate and regular rhythm. S1 and S2 normal without gallop, murmur, click, rub or other extra sounds.    Impression & Recommendations:  Problem # 1:  ALLERGIC RHINITIS (ICD-477.9) Samples of AllegraD 1 tab by mouth two times a day for 7 days Samples of Singulair 10 mg by mouth daily fo 1 month Follow up with [facility] in 1 month Her updated medication list for this problem includes:    Allegra 180 Mg Tabs (Fexofenadine hcl) .Marland Kitchen... 1 by mouth once daily    Nasonex 50 Mcg/act Susp (Mometasone furoate) .Marland Kitchen... 2 sprays each nostril daily   Problem # 2:  PELVIC PAIN, LEFT (ICD-789.09) Take ibuprofen 200 mg 3-4 tabs three  times a day with food when has pain with periods.  Problem # 3:  OVARIAN MASS (ICD-625.8) Not clear what this is. Did speak with Radiology--CT of pelvis misread in body of report as showing left adnexal cystic lesion.  Lesion actually on right. Gyn referral Orders: Gynecologic Referral (Gyn)   Complete Medication List: 1)  Allegra 180 Mg Tabs (Fexofenadine hcl) .Marland Kitchen... 1 by mouth once daily 2)  Nasonex 50 Mcg/act Susp (Mometasone furoate) .... 2 sprays each nostril daily 3)  Singulair 10 Mg Tabs (Montelukast sodium) .Marland Kitchen... 1 tab by mouth daily   Patient Instructions: 1)  Take ibuprofen(Advil) 3-4 tablets three times daily when pain with periods. 2)  You should be hearing from us about an appt for the gynecologist 3)  Call for a follow up with  [facility] in 1 month. 4)  Take one tablet of Singulair daily 5)  Take 1 tablet twice daily of Allegra D.  When you get the plain Allegra filled(Fexofenadine)  you will take just 1 tablet a day 6)  Continue the Nasocort.    Prescriptions: NASONEX 50 MCG/ACT SUSP (MOMETASONE FUROATE) 2 sprays each nostril daily  #1 x 6   Entered and Authorized by:   Julieanne MansonElizabeth Lexys Milliner MD   Signed by:   Julieanne MansonElizabeth Jeannett Dekoning MD on 12/28/2006   Method used:   Print then Give to Patient   RxID:   [id number] ALLEGRA 180 MG TABS (FEXOFENADINE HCL) 1 by mouth once daily  #30 x 6   Entered and Authorized by:   Julieanne MansonElizabeth Ulus Hazen MD   Signed by:   Julieanne MansonElizabeth Mikesha Migliaccio MD on 12/28/2006   Method used:   Print then Give to Patient   RxID:   [id number]  ] Laboratory Results  Date/Time Received: December 28, 2006 11:50 AM   Other Tests  Rapid Strep: negative

## 2010-04-20 NOTE — Assessment & Plan Note (Signed)
Summary: FOLLOW-UP///BC   Vital Signs:  Patient profile:   35 year old female LMP:     07/24/2008 Height:      59 inches Weight:      161 pounds BMI:     32.64 Temp:     97.2 degrees F Pulse rate:   92 / minute Pulse rhythm:   regular Resp:     20 per minute BP sitting:   120 / 78  (left arm) Cuff size:   regular  Vitals Entered By: Vesta Mixeriffany McCoy CMA (Aug 07, 2008 3:43 PM) CC: 2 month f/u on blood test done the last time she was here and would like a rx for clairtin. Is Patient Diabetic? No Pain Assessment Patient in pain? no       Does patient need assistance? Ambulation Normal Comments not taking clairtin because she lost the bottle LMP (date): 07/24/2008 LMP - Character: 03/21/07 then came back on 03/24/07  years   days  Enter LMP: 07/24/2008 Last PAP Result Normal   History of Present Illness: 1.  Elevated liver enzymes:  Much improved in March, but SGPT still slightly elevated.  Pt. was taking a lot of ibuprofen at one time--stopped before last check.  2.  Uterine and adnexal concerns:  Left uterine mass--fibroid that appears stable.  Right adnexal nodule resolved with U/S 3/10.  No further evaluation needed.  3.  Allergic Rhinitis:  Needs a refill of Claritin  4.  Obesity:  Has made lifestyle changes with exercise and diet--trying very hard to lose weight.  Allergies (verified): No Known Drug Allergies  Physical Exam  Lungs:  Normal respiratory effort, chest expands symmetrically. Lungs are clear to auscultation, no crackles or wheezes. Heart:  Normal rate and regular rhythm. S1 and S2 normal without gallop, murmur, click, rub or other extra sounds.   Impression & Recommendations:  Problem # 1:  OBESITY (ICD-278.00) Encouraged continued changes  Problem # 2:  ELEVATED LIVER ENZYMES (ICD-790.5)  Orders: T-Hepatic Function ([id number])  Problem # 3:  OVARIAN MASS (ICD-625.8) Resolved.  fibroid stable.  Complete Medication List: 1)  Singulair 10  Mg Tabs (Montelukast sodium) .Marland Kitchen... 1 tab by mouth daily 2)  Claritin 10 Mg Tabs (Loratadine) .... 1/2 tab by mouth in morning 3)  Aciphex 20 Mg Tbec (Rabeprazole sodium) .Marland Kitchen... 1 tab by mouth on empty stomach in morning  Patient Instructions: 1)  CPP with Dr. Delrae AlfredMulberry in next 4 months

## 2010-04-20 NOTE — Letter (Signed)
Summary: *Referral Letter  [facility]  [address]   [place], [place] [address]   Phone: [phone]  Fax: [phone]    12/29/2006  Dear Doctor: Thank you in advance for agreeing to see my patient:  Annette Wolfe [address], [address] [place], [place]  [address]  Phone: [phone]  Reason for Referral:  Right adnexal mass showing bilobed cystic mass on pelvic CT from 8/08 and a nodule in same area on Pelvic U/S  9/08.  This is asymptomatic.  CT initially obtained secondary to left pelvic pain during menses.  Procedures Requested:Evaluation and any recommendations for treatment or follow up   Current Medical Problems: 1)  OVARIAN MASS (ICD-625.8) 2)  PELVIC PAIN, LEFT (ICD-789.09) 3)  ALLERGIC RHINITIS (ICD-477.9)   Current Medications: 1)  ALLEGRA 180 MG TABS (FEXOFENADINE HCL) 1 by mouth once daily 2)  NASONEX 50 MCG/ACT SUSP (MOMETASONE FUROATE) 2 sprays each nostril daily 3)  SINGULAIR 10 MG  TABS (MONTELUKAST SODIUM) 1 tab by mouth daily   Past Medical History: 1)  Allergic rhinitis   Prior History of Blood Transfusions:   Pertinent Labs: None   Thank you again for agreeing to see our patient; please contact us if you have any further questions or need additional information.  Sincerely,  Julieanne MansonElizabeth Larue Lightner MD

## 2010-04-20 NOTE — Letter (Signed)
Summary: REFERRAL/PHYSICAL THERAPY  REFERRAL/PHYSICAL THERAPY   Imported By: Arta BruceShelia Stanislawscyk 06/16/2008 11:42:42  _____________________________________________________________________  External Attachment:    Type:   Image     Comment:   External Document

## 2010-04-20 NOTE — Assessment & Plan Note (Signed)
Summary: follow up visit with Dr Delrae AlfredMulberry in 2 months//gk   Vital Signs:  Patient profile:   35 year old female Weight:      164 pounds Temp:     97.7 degrees F Pulse rate:   100 / minute Pulse rhythm:   regular Resp:     20 per minute BP sitting:   160 / 100  (left arm) Cuff size:   regular  Vitals Entered By: Vesta Mixeriffany McCoy CMA (June 05, 2008 4:39 PM) CC: f/u test results.   BP is elevated pt nervous Is Patient Diabetic? No Pain Assessment Patient in pain? no       Does patient need assistance? Ambulation Normal   History of Present Illness: 1.  Ovarian Cyst:  Looking back on old records, pt. never followed up last year after missing her rescheduled pelvic ultrasound--states it snowed and she couldn't get in.  Has only been seen for illnesses since and has not been addressed.    2.  Neck Pain:  C- spine only showed signs of muscle spasm.  Right arm and radial fingers of hand still painful and numb, especially when sleeping, but also during day.  Is right handed and uses right hand for cleaning throughout the day.    3.  Elevated liver enzymes:  Viral Hepatitis labs negative, though is immune to Hep A at this point.  Was using Ibuprofen a lot at time of original blood draw with liver enzymes.    4.  Hyperglycemia:  blood sugar was elevated at last OV-138.  Fasting glucose returned at 119.  5.  Throat irritation:  Barium swallow was normal without reflux.  Made significant changes in diet--no sodas, no spicy foods.  Eating healthier and is better.  Has a bit of discomfort, but nothing like before.  Is using Aciphex.  Did take allergy meds for a while and also helped, but stopped as was feeling better.  Allergies: No Known Drug Allergies   Impression & Recommendations:  Problem # 1:  ELEVATED LIVER ENZYMES (ICD-790.5)  Orders: T-Hepatic Function ([id number]([id number])  Problem # 2:  HYPERGLYCEMIA (ICD-790.29)  Orders: Nutrition Referral (Nutrition)  Problem # 3:  CARPAL  TUNNEL SYNDROME, RIGHT (ICD-354.0)  Cock up splint. Avoid NSAIDS for now  Orders: Physical Therapy Referral (PT)  Problem # 4:  NECK PAIN, CHRONIC (ICD-723.1)  Rerefer to PT  Orders: Physical Therapy Referral (PT)  Problem # 5:  OVARIAN MASS (ICD-625.8)  Orders: Radiology Referral (Radiology) repeat pelvic ultrasound  Complete Medication List: 1)  Singulair 10 Mg Tabs (Montelukast sodium) .Marland Kitchen... 1 tab by mouth daily 2)  Claritin 10 Mg Tabs (Loratadine) .... 1/2 tab by mouth in morning 3)  Aciphex 20 Mg Tbec (Rabeprazole sodium) .Marland Kitchen... 1 tab by mouth on empty stomach in morning  Patient Instructions: 1)  Cock up splint for right wrist--for carpal tunnel syndrome.  Wear every night. 2)  Follow up with Dr. Delrae AlfredMulberry in 2 months. 3)  The medication list was reviewed and reconciled.  All changed / newly prescribed medications were explained.  A complete medication list was provided to the patient / caregiver.  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  Problem # 2:  FATIGUE (ICD-780.79)  Pt. gives hx of dry skin and family hx of thyroid disease--wondering if we could check  that  Orders: T-TSH ([id number])  Complete Medication List: 1)  Singulair 10 Mg Tabs (Montelukast sodium) .Marland Kitchen.. 1 tab by mouth daily 2)  Claritin 10 Mg Tabs (Loratadine) .... 1/2 tab by mouth in morning 3)  Aciphex 20 Mg Tbec (Rabeprazole sodium) .Marland Kitchen.. 1 tab by mouth on empty stomach in morning

## 2010-06-06 LAB — POCT PREGNANCY, URINE: Preg Test, Ur: NEGATIVE

## 2010-06-25 LAB — URINALYSIS, ROUTINE W REFLEX MICROSCOPIC
Bilirubin Urine: NEGATIVE
Glucose, UA: NEGATIVE mg/dL
Ketones, ur: 15 mg/dL — AB
Leukocytes, UA: NEGATIVE
Nitrite: NEGATIVE
Protein, ur: NEGATIVE mg/dL
Specific Gravity, Urine: 1.016 (ref 1.005–1.030)
Urobilinogen, UA: 0.2 mg/dL (ref 0.0–1.0)
pH: 6.5 (ref 5.0–8.0)

## 2010-06-25 LAB — DIFFERENTIAL
Basophils Absolute: 0.1 10*3/uL (ref 0.0–0.1)
Basophils Relative: 1 % (ref 0–1)
Eosinophils Absolute: 0.1 10*3/uL (ref 0.0–0.7)
Eosinophils Relative: 1 % (ref 0–5)
Lymphocytes Relative: 35 % (ref 12–46)
Lymphs Abs: 3.3 10*3/uL (ref 0.7–4.0)
Monocytes Absolute: 0.5 10*3/uL (ref 0.1–1.0)
Monocytes Relative: 5 % (ref 3–12)
Neutro Abs: 5.6 10*3/uL (ref 1.7–7.7)
Neutrophils Relative %: 59 % (ref 43–77)

## 2010-06-25 LAB — URINE MICROSCOPIC-ADD ON

## 2010-06-25 LAB — BASIC METABOLIC PANEL
BUN: 12 mg/dL (ref 6–23)
CO2: 29 mEq/L (ref 19–32)
Calcium: 8.8 mg/dL (ref 8.4–10.5)
Chloride: 103 mEq/L (ref 96–112)
Creatinine, Ser: 0.6 mg/dL (ref 0.4–1.2)
GFR calc Af Amer: >60 mL/min (ref >60)
GFR calc non Af Amer: >60 mL/min (ref >60)
Glucose, Bld: 161 mg/dL — ABNORMAL HIGH (ref 70–99)
Potassium: 3.3 mEq/L — ABNORMAL LOW (ref 3.5–5.1)
Sodium: 139 mEq/L (ref 135–145)

## 2010-06-25 LAB — CBC
HCT: 39.6 % (ref 36.0–46.0)
Hemoglobin: 13.7 g/dL (ref 12.0–15.0)
MCHC: 34.6 g/dL (ref 30.0–36.0)
MCV: 87.6 fL (ref 78.0–100.0)
Platelets: 224 10*3/uL (ref 150–400)
RBC: 4.52 MIL/uL (ref 3.87–5.11)
RDW: 12.1 % (ref 11.5–15.5)
WBC: 9.5 10*3/uL (ref 4.0–10.5)

## 2010-06-25 LAB — POCT PREGNANCY, URINE: Preg Test, Ur: NEGATIVE

## 2010-12-02 ENCOUNTER — Ambulatory Visit: Payer: Self-pay | Admitting: Obstetrics and Gynecology

## 2010-12-24 ENCOUNTER — Ambulatory Visit: Payer: Self-pay | Admitting: Obstetrics and Gynecology

## 2011-01-26 ENCOUNTER — Emergency Department (INDEPENDENT_AMBULATORY_CARE_PROVIDER_SITE_OTHER)
Admission: EM | Admit: 2011-01-26 | Discharge: 2011-01-26 | Disposition: A | Discharge status: Home / Self Care | Payer: Self-pay | Source: Home / Self Care | Attending: Family Medicine | Admitting: Family Medicine

## 2011-01-26 ENCOUNTER — Emergency Department (INDEPENDENT_AMBULATORY_CARE_PROVIDER_SITE_OTHER): Payer: Worker's Compensation

## 2011-01-26 DIAGNOSIS — S161XXA Strain of muscle, fascia and tendon at neck level, initial encounter: Secondary | ICD-10-CM

## 2011-01-26 DIAGNOSIS — S139XXA Sprain of joints and ligaments of unspecified parts of neck, initial encounter: Secondary | ICD-10-CM

## 2011-01-26 MED ORDER — NAPROXEN 375 MG PO TABS: 375.0000 mg | ORAL_TABLET | Freq: Two times a day (BID) | ORAL | Status: DC

## 2011-01-26 NOTE — ED Notes (Signed)
States she fell on Sunday 11-5, at workplace, and per her daughter, she had a loss of consciousness, accompanied w pain, nausea,  in neck on right side , right shoulder; reportedly her manager on job told her she was okay, and she was unsure how she should go from there; NAD at preent

## 2011-01-26 NOTE — Discharge Instructions (Signed)
Take medication as directed. Also, perform exercises as shown. May use mild heat for 10 minutes at a time, two to three times daily. Return to care should your symptoms not improve, or worsen in any way. Esguince y distensin cervical y del cuello (Cervical Sprain and Strain) Consiste en una traumatismo en el cuello. La lesin puede deberse a un sobreestiramiento o incluso pequeos desgarros en los ligamentos que [employer] huesos del cuello en su sitio. Un esguince afecta msculos y tendones. Las lesiones menores solo suponen ligamentos y msculos. Debido a que las partes del cuello estn muy juntas, puede haber ms lesiones graves que provoquen esguince y distensin. Estas lesiones pueden afectar los msculos, ligamentos, tendones, discos y nervios del cuello. CAUSAS La lesin puede ser el resultado de un golpe directo o de ciertos hbitos que pueden llevar a los sntomas mencionados.  Lesiones por:   Deportes de Pharmacologist (como ftbol americano, rugby, [place], hockey, automovilismo, gimnasia, buceo, artes marciales y boxeo).   Accidentes en vehculos.   Traumatismo cervical (ver imagen). Este tipo de lesiones son frecuentes. Ocurren cuando el cuello se sacude o cuando se fuerza hacia adelante o hacia atrs.   Cadas   Posturas incmodas.   Sostener el telfono entre la oreja y el hombro.   Sentarse en una silla sin respaldo.   Trabajar en un ordenador con [employer].   Actividades que requieren Leisure centre manager periodo en posicin de mirar hacia arriba (con el cuello hacia atrs) o hacia abajo (con el cuello doblado hacia adelante).  SNTOMAS  Dolor, rigidez, o sensacin de [employer], espalda o lados del cuello. Esto podra aparecer inmediatamente despus de la lesin. El comienzo de las molestias tambin podra desarrollarse lentamente y no comenzar hasta 24 horas despus o ms.   Dolor en los hombros o en la espalda.   Lmites al normal movimiento del cuello.   Dolor de  [place].   Mareos.   Debilitamiento o sensaciones anormales (como adormecimiento u hormigueo) de uno o ambos brazos o manos.   Espasmos musculares.   Dificultad para tragar o masticar.   Sensibilidad e hinchazn en la zona de la lesin.  DIAGNSTICO En la mayor parte de los casos, el mdico puede diagnosticar este problema mediante un examen y el conocimiento de los antecedentes. El historial incluir informacin acerca de problemas conocidos (como artritis en el cuello) o una lesin previa del cuello. Podrn tomarle radiografas para comprobar si hay otros problemas. Los rayos x tambin podrn ayudarle a Scientist, water quality de los huesos del cuello que no estn relacionados con la lesin ni con los sntomas actuales. TRATAMIENTO Diversas opciones de tratamiento estn disponibles para Engineer, materials, los espasmos y otros sntomas. Entre ellos se incluyen:  El fro se [place] para Engineer, materials y Social research officer, government. Debe aplicarse durante 10 a 15 minutos cada 2  3 horas despus de cualquier actividad que agrave sus sntomas. Utilice bolsas o un masaje de hielo. Colquese una toalla entre la piel y la bolsa de hielo.   Medicamentos:   [employer] de venta libre o de prescripcin para Chief Technology Officer, Environmental health practitioner o la [place], segn se lo indique el profesional que lo asiste.   Cambio de la actividad que ha causado el problema. Esto puede incluir la utilizacin de un auricular para el telfono para que no deba colocarlo entre la [place] y [place].   Collar para el cuello. El mdico podr recomendarle el uso temporal de un  collar cervical blando.   El Environmental consultantlugar de [place] Heightstrabajo. Necesitar Psychiatristrealizar cambios en su lugar de [place]rabajo. Neomia DearUna mejor posicin para sentarse o una mejor postura durante el Bran[place] Centertrabajo podr ser parte del tratamiento.   Terapia fsica. El mdico podr recomendarle fisioterapia. Esto puede incluir instrucciones para realizar ejercicios de estiramiento y fortalecimiento.  Es importante la mejora en la postura. Los ejercicios y mejoras en la postura pueden ayudar a estabilizar el cuello y Chief Operating Officerfortalecer los msculos para evitar la reaparicin de los sntomas.  CUIDADOS EN EL HOGAR:  Aparte de la fisioterapia, podrn realizarse todos los tratamientos en casa. Incluso cuando no est en el trabajo, es importante ser consciente de la postura y las actividades que pueden producir la reaparicin de los sntomas. La mayora de los esguinces y distensiones cervicales mejoran entre 1 y 3 semanas. A medida que mejore y aumente sus actividades, la realizacin de un precalentamiento y estiramiento antes de cada actividad le ayudar a Air traffic controllerprevenir la recurrencia. SOLICITE ATENCIN MDICA SI:   El dolor no cesa con Engineer, maintenance (IT)la medicacin prescripta.   Siente que no puede dejar de Associate Professortomar la medicacin como se le indic.   No puede mejorar el nivel de actividad segn lo planeado/esperado.  SOLICITE ATENCIN MDICA DE INMEDIATO SI:   Si observa sangrado, malestar estomacal o signos de Freight forwarderalguna reaccin alrgica.   Los sntomas empeoran, se vuelven intolerables o no se alivian con la medicacin.   Le aparecen nuevos e inexplicables sntomas.   Siente debilidad, hormigueo, adormecimiento o parlisis en alguna parte del cuerpo.  ASEGRESE DE QUE:   Comprende esas instrucciones para el alta mdica.   Controlar su enfermedad.   Pedir ayuda de inmediato si no lo hace bien o empeora.  Document Released: 06/03/2008 Document Revised: 11/17/2010 [facility] Patient Information 2012 [place], [place].

## 2011-01-26 NOTE — ED Provider Notes (Signed)
History     CSN: [id number] Arrival date & time: 01/26/2011  7:21 PM   First MD Initiated Contact with Patient 01/26/11 1927      Chief Complaint  Patient presents with  . Fall    (Consider location/radiation/quality/duration/timing/severity/associated sxs/prior treatment) Patient is a 35 y.o. female presenting with fall. The history is provided by the patient. The history is limited by a language barrier.  Fall The accident occurred more than 2 days ago. The fall occurred while walking. She landed on carpet. The point of impact was the head and right shoulder. The pain is present in the neck and right shoulder. The pain is mild. She was ambulatory at the scene. Pertinent negatives include no visual change, no numbness, no vomiting and no tingling. The symptoms are aggravated by activity, flexion, extension and rotation. She has tried nothing for the symptoms.    History reviewed. No pertinent past medical history.  History reviewed. No pertinent past surgical history.  History reviewed. No pertinent family history.  History  Substance Use Topics  . Smoking status: Never Smoker   . Smokeless tobacco: Not on file  . Alcohol Use: No    OB History    Grav Para Term Preterm Abortions TAB SAB Ect Mult Living                  Review of Systems  Constitutional: Negative.   HENT: Positive for neck pain and neck stiffness. Negative for trouble swallowing.   Eyes: Negative.   Respiratory: Negative.   Cardiovascular: Negative.   Gastrointestinal: Negative for vomiting.  Musculoskeletal: Positive for back pain.  Neurological: Negative for tingling, weakness and numbness.    Allergies  Review of patient's allergies indicates no known allergies.  Home Medications  No current outpatient prescriptions on file.  BP 191/104  Pulse 81  Temp(Src) 97.4 F (36.3 C) (Oral)  Resp 20  SpO2 98%  Physical Exam  Constitutional: She is oriented to person, place, and time. She  appears well-developed and well-nourished.  HENT:  Head: Normocephalic and atraumatic.  Eyes: EOM are normal. Pupils are equal, round, and reactive to light.  Neck: Normal range of motion. Neck supple. Muscular tenderness present.         Full range of motion in neck but limited by pain; no bony tenderness  Musculoskeletal: Normal range of motion.       Right shoulder: She exhibits tenderness. She exhibits no bony tenderness.       Arms:      Tenderness to palpation over RIGHT trapezius, no bony tenderness over cervical spine; 5/5 strength bilateral upper extremities but limited by pain  Neurological: She is alert and oriented to person, place, and time.  Skin: Skin is warm and dry.    ED Course  Procedures (including critical care time)  Labs Reviewed - No data to display No results found.   No diagnosis found.    MDM  Cervical xray: normal, no acute bony findings        Richardo Priestonnie Laney, MD 01/26/11 2051

## 2011-02-28 ENCOUNTER — Emergency Department (INDEPENDENT_AMBULATORY_CARE_PROVIDER_SITE_OTHER)
Admission: EM | Admit: 2011-02-28 | Discharge: 2011-02-28 | Disposition: A | Discharge status: Home / Self Care | Payer: Self-pay | Source: Home / Self Care | Attending: Family Medicine | Admitting: Family Medicine

## 2011-02-28 ENCOUNTER — Encounter (HOSPITAL_COMMUNITY): Payer: Self-pay | Admitting: *Deleted

## 2011-02-28 DIAGNOSIS — G5600 Carpal tunnel syndrome, unspecified upper limb: Secondary | ICD-10-CM

## 2011-02-28 DIAGNOSIS — M778 Other enthesopathies, not elsewhere classified: Secondary | ICD-10-CM

## 2011-02-28 DIAGNOSIS — M67919 Unspecified disorder of synovium and tendon, unspecified shoulder: Secondary | ICD-10-CM

## 2011-02-28 DIAGNOSIS — G5601 Carpal tunnel syndrome, right upper limb: Secondary | ICD-10-CM

## 2011-02-28 MED ORDER — DICLOFENAC SODIUM 1 % TD GEL: 1.0000 "application " | Freq: Four times a day (QID) | TRANSDERMAL | Status: DC

## 2011-02-28 NOTE — ED Notes (Signed)
Pt  Has  Symptoms  Of  r  Arm /  Back  And   Neck pain for  One  Month  Treated  For  Similar  Episode  approx  1  Month  Ago  And  Was  Doing better  Until meds  Ran out        denys  Any recent specefic  injury

## 2011-02-28 NOTE — ED Provider Notes (Signed)
History     CSN: [id number] Arrival date & time: 02/28/2011  3:56 PM   First MD Initiated Contact with Patient 02/28/11 1413      Chief Complaint  Patient presents with  . Arm Pain    (Consider location/radiation/quality/duration/timing/severity/associated sxs/prior treatment) Patient is a 35 y.o. female presenting with shoulder pain. The history is provided by the patient and a relative. The history is limited by a language barrier (daughter translated.).  Shoulder Pain This is a recurrent problem. The current episode started more than 1 week ago (seen 1 mo ago , given med which helped, ran out and sx relapsed.Marland Kitchen). The problem occurs constantly. The problem has been gradually improving.    History reviewed. No pertinent past medical history.  History reviewed. No pertinent past surgical history.  History reviewed. No pertinent family history.  History  Substance Use Topics  . Smoking status: Never Smoker   . Smokeless tobacco: Not on file  . Alcohol Use: No    OB History    Grav Para Term Preterm Abortions TAB SAB Ect Mult Living                  Review of Systems  Constitutional: Negative.   Musculoskeletal: Positive for joint swelling and arthralgias.  Neurological: Positive for numbness.    Allergies  Review of patient's allergies indicates no known allergies.  Home Medications   Current Outpatient Rx  Name Route Sig Dispense Refill  . DICLOFENAC SODIUM 1 % TD GEL Topical Apply 1 application topically 4 (four) times daily. 4 gram to shoulder qid, please instruct 3 Tube 1  . NAPROXEN 375 MG PO TABS Oral Take 1 tablet (375 mg total) by mouth 2 (two) times daily. 20 tablet 0    BP 166/81  Pulse 74  Temp(Src) 98.6 F (37 C) (Oral)  Resp 16  SpO2 99%  Physical Exam  Constitutional: She is oriented to person, place, and time. She appears well-developed and well-nourished.  Musculoskeletal: Normal range of motion. She exhibits tenderness.        Arms: Neurological: She is alert and oriented to person, place, and time. She has normal reflexes. No cranial nerve deficit.    ED Course  Procedures (including critical care time)  Labs Reviewed - No data to display No results found.   1. Tendonitis of shoulder, right   2. Carpal tunnel syndrome of right wrist       MDM          Barkley Bruns, MD 02/28/11 [phone]

## 2011-04-13 ENCOUNTER — Encounter (HOSPITAL_COMMUNITY): Payer: Self-pay

## 2011-04-13 ENCOUNTER — Emergency Department (HOSPITAL_COMMUNITY)
Admission: EM | Admit: 2011-04-13 | Discharge: 2011-04-13 | Disposition: A | Discharge status: Home / Self Care | Payer: Self-pay | Source: Home / Self Care

## 2011-04-13 DIAGNOSIS — M25519 Pain in unspecified shoulder: Secondary | ICD-10-CM

## 2011-04-13 DIAGNOSIS — M25511 Pain in right shoulder: Secondary | ICD-10-CM

## 2011-04-13 MED ORDER — NAPROXEN 375 MG PO TABS: 375.0000 mg | ORAL_TABLET | Freq: Two times a day (BID) | ORAL | Status: DC

## 2011-04-13 NOTE — Discharge Instructions (Signed)
Continue Naproxen as prescribed. Ice your shoulder as needed for discomfort. Call Dr Hewitt's office to schedule a follow up appt.

## 2011-04-13 NOTE — ED Provider Notes (Signed)
History     CSN: [id number]  Arrival date & time 04/13/11  1702   None     Chief Complaint  Patient presents with  . Arm Pain    (Consider location/radiation/quality/duration/timing/severity/associated sxs/prior treatment) HPI Comments: Rt shoulder pain x 2 months. Pt states she fell on her shoulder 2 months ago. Had neg xray and has been seen twice for pain. Pain persists, and shoulder at times feels "frozen."  Advil and Naproxen help but pain persists.  No history of hypertension. Denies chest pain, dyspnea, HA or dizziness.    History reviewed. No pertinent past medical history.  History reviewed. No pertinent past surgical history.  History reviewed. No pertinent family history.  History  Substance Use Topics  . Smoking status: Never Smoker   . Smokeless tobacco: Not on file  . Alcohol Use: No    OB History    Grav Para Term Preterm Abortions TAB SAB Ect Mult Living                  Review of Systems  Musculoskeletal: Negative for back pain and joint swelling.  Skin: Negative for color change and rash.  Neurological: Negative for weakness and numbness.    Allergies  Review of patient's allergies indicates no known allergies.  Home Medications   Current Outpatient Rx  Name Route Sig Dispense Refill  . NAPROXEN 375 MG PO TABS Oral Take 1 tablet (375 mg total) by mouth 2 (two) times daily. 20 tablet 0    BP 140/86  Pulse 95  Temp(Src) 99.1 F (37.3 C) (Oral)  Resp 18  SpO2 100%  LMP 04/07/2011  Physical Exam  Nursing note and vitals reviewed. Constitutional: She appears well-developed and well-nourished. No distress.  Cardiovascular: Normal rate, regular rhythm and normal heart sounds.   Pulses:      Radial pulses are 2+ on the right side, and 2+ on the left side.  Pulmonary/Chest: Effort normal and breath sounds normal. No respiratory distress.  Musculoskeletal:       Right shoulder: She exhibits tenderness (anterior shoulder tenderness). She  exhibits normal range of motion, no swelling, no effusion, no deformity, no pain, no spasm, normal pulse and normal strength.  Neurological: She is alert. She has normal strength. No sensory deficit.  Skin: Skin is warm and dry.  Psychiatric: She has a normal mood and affect.    ED Course  Procedures (including critical care time)  Labs Reviewed - No data to display No results found.   1. Right shoulder pain       MDM  Persistent Rt shoulder pain. Referred to ortho for f/u.         Melody Comas, [place] 04/13/11 2136

## 2011-04-13 NOTE — ED Notes (Signed)
Pain for 2 months in her right arm and shoulder; c/o pain wakes her at times, pain worse w some motions, pain worse when she uses it to get out of bed in AM; was here 1 month ago, but has run out of meds; was reportedly  told she may need an injection in her shoulder for the pain she is having

## 2011-04-15 NOTE — ED Provider Notes (Signed)
Medical screening examination/treatment/procedure(s) were performed by non-physician practitioner and as supervising physician I was immediately available for consultation/collaboration.  Corrie Mckusick, MD 04/15/11 [phone]

## 2011-05-18 ENCOUNTER — Other Ambulatory Visit: Payer: Self-pay

## 2011-05-18 ENCOUNTER — Emergency Department (HOSPITAL_COMMUNITY)
Admission: EM | Admit: 2011-05-18 | Discharge: 2011-05-18 | Disposition: A | Discharge status: Home / Self Care | Payer: Self-pay | Attending: Emergency Medicine | Admitting: Emergency Medicine

## 2011-05-18 ENCOUNTER — Encounter (HOSPITAL_COMMUNITY): Payer: Self-pay | Admitting: *Deleted

## 2011-05-18 ENCOUNTER — Emergency Department (HOSPITAL_COMMUNITY): Payer: Self-pay

## 2011-05-18 DIAGNOSIS — I10 Essential (primary) hypertension: Secondary | ICD-10-CM | POA: Insufficient documentation

## 2011-05-18 DIAGNOSIS — E119 Type 2 diabetes mellitus without complications: Secondary | ICD-10-CM | POA: Insufficient documentation

## 2011-05-18 DIAGNOSIS — R569 Unspecified convulsions: Secondary | ICD-10-CM | POA: Insufficient documentation

## 2011-05-18 HISTORY — DX: Essential (primary) hypertension: I10

## 2011-05-18 HISTORY — DX: Unspecified convulsions: R56.9

## 2011-05-18 HISTORY — DX: Complete or unspecified spontaneous abortion without complication: O03.9

## 2011-05-18 HISTORY — DX: Unspecified fracture of shaft of humerus, right arm, initial encounter for closed fracture: S42.301A

## 2011-05-18 LAB — BASIC METABOLIC PANEL
BUN: 13 mg/dL (ref 6–23)
CO2: 26 mEq/L (ref 19–32)
Calcium: 9.5 mg/dL (ref 8.4–10.5)
Chloride: 98 mEq/L (ref 96–112)
Creatinine, Ser: 0.61 mg/dL (ref 0.50–1.10)
GFR calc Af Amer: >90 mL/min (ref >90)
GFR calc non Af Amer: >90 mL/min (ref >90)
Glucose, Bld: 304 mg/dL — ABNORMAL HIGH (ref 70–99)
Potassium: 3.6 mEq/L (ref 3.5–5.1)
Sodium: 136 mEq/L (ref 135–145)

## 2011-05-18 LAB — CBC
HCT: 42.5 % (ref 36.0–46.0)
Hemoglobin: 14.7 g/dL (ref 12.0–15.0)
MCH: 29.4 pg (ref 26.0–34.0)
MCHC: 34.6 g/dL (ref 30.0–36.0)
MCV: 85 fL (ref 78.0–100.0)
Platelets: 217 10*3/uL (ref 150–400)
RBC: 5 MIL/uL (ref 3.87–5.11)
RDW: 12.1 % (ref 11.5–15.5)
WBC: 10.6 10*3/uL — ABNORMAL HIGH (ref 4.0–10.5)

## 2011-05-18 LAB — PREGNANCY, URINE: Preg Test, Ur: NEGATIVE

## 2011-05-18 MED ORDER — SODIUM CHLORIDE 0.9 % IV BOLUS (SEPSIS)
1000.0000 mL | Freq: Once | INTRAVENOUS | Status: AC
  Administered 2011-05-18: 1000 mL via INTRAVENOUS

## 2011-05-18 MED ORDER — METFORMIN HCL 500 MG PO TABS: 500.0000 mg | ORAL_TABLET | Freq: Two times a day (BID) | ORAL | Status: AC

## 2011-06-14 ENCOUNTER — Emergency Department (INDEPENDENT_AMBULATORY_CARE_PROVIDER_SITE_OTHER)
Admission: EM | Admit: 2011-06-14 | Discharge: 2011-06-14 | Disposition: A | Discharge status: Home / Self Care | Payer: Self-pay | Source: Home / Self Care

## 2011-06-14 ENCOUNTER — Encounter (HOSPITAL_COMMUNITY): Payer: Self-pay | Admitting: Emergency Medicine

## 2011-06-14 DIAGNOSIS — M545 Low back pain, unspecified: Secondary | ICD-10-CM

## 2011-06-14 DIAGNOSIS — M79671 Pain in right foot: Secondary | ICD-10-CM

## 2011-06-14 DIAGNOSIS — G8929 Other chronic pain: Secondary | ICD-10-CM

## 2011-06-14 DIAGNOSIS — M79609 Pain in unspecified limb: Secondary | ICD-10-CM

## 2011-06-14 DIAGNOSIS — M25519 Pain in unspecified shoulder: Secondary | ICD-10-CM

## 2011-06-14 HISTORY — DX: Essential (primary) hypertension: I10

## 2011-06-14 MED ORDER — IBUPROFEN 800 MG PO TABS: 800.0000 mg | ORAL_TABLET | Freq: Three times a day (TID) | ORAL | Status: AC

## 2011-06-14 NOTE — ED Notes (Signed)
Patient has pain in right shoulder and lower back and right foot.  This pain is reported as related to a work related injury.  Patient has unclear follow up

## 2011-06-14 NOTE — ED Provider Notes (Signed)
Medical screening examination/treatment/procedure(s) were performed by non-physician practitioner and as supervising physician I was immediately available for consultation/collaboration.   MORENO-COLL,Brandis Wixted; MD   Bethani Brugger Moreno-Coll, MD 06/14/11 2220 

## 2011-06-14 NOTE — Discharge Instructions (Signed)
You need evaluation and care from an orthopedic physician.  Call Dr Ashok Norris office for follow up appt.   Dolor en el hombro (Shoulder Pain) Usted fue revisado en el da de hoy por presentar dolor en el hombro. El hombro es una articulacin de una esfera y Neomia Dear cavidad. Los msculos y los tendones (manguito rotador) mantienen el hombro estable y en su articulacin. Este conjunto de msculos y tendones sostienen la cabeza (esfera) del hmero (hueso de la parte superior del brazo) en la fosa (cavidad) de la escpula (omplato). En el da de [place], no se ha encontrado una causa para su dolor en el hombro. Generalmente, el dolor en el hombro puede tratarse con medidas conservadoras que consisten en la inmovilizacin temporaria (manteniendo el hombro en su lugar por medio de un cabestrillo) y reposo. Si los problemas continan podr necesitar fisioterapia. INSTRUCCIONES PARA EL CUIDADO DOMICILIARIO  Aplique hielo sobre el rea dolorida durante 15 a 20 minutos 3 a 4 veces por [employer] primeros 2 [address]  [address]. Ponga el hielo en una bolsa plstica y coloque una toalla entre la bolsa y la piel.   Si le han inmovilizado el brazo (con un cabestrillo y correas), no los retire Physiological scientist que se lo indique su mdico o que lo vea un profesional en la visita de seguimiento. Si necesita quitarlos para darse [place] o un bao, mueva el brazo lo menos posible.   Podr dormir sobre varias almohadas para disminuir la hinchazn y Chief Technology Officer.   Utilice los medicamentos de venta libre o de prescripcin para Chief Technology Officer, Environmental health practitioner o la [place], segn se lo indique el profesional que lo asiste.   Es muy importante cumplir con todas las citas de seguimiento con el objeto de Automotive engineer complicaciones en el hombro y dolor crnico o discapacidad.  SOLICITE ATENCIN MDICA SI:  El dolor en el hombro [place], o siente un nuevo dolor en el brazo, en la mano o en los dedos.   La mano o los dedos estn ms fros que la [place].     No obtiene [employer], o el dolor [place].  SOLICITE ATENCIN MDICA DE INMEDIATO SI:  El brazo, la mano o los dedos estn adormecidos o siente hormigueos.   El brazo, la mano o los dedos estn hinchados, le duelen o se ven blancos o [place].   Siente falta de aire o Journalist, newspaper.  EST SEGURO QUE:   Comprende las instrucciones para el alta mdica.   Controlar su enfermedad.   Solicitar atencin mdica de inmediato segn las indicaciones.  Document Released: 12/15/2004 Document Revised: 02/24/2011 [facility] Patient Information 2012 [place], [place].

## 2011-06-14 NOTE — ED Provider Notes (Signed)
History     CSN: [id number]  Arrival date & time 06/14/11  1726   None     Chief Complaint  Patient presents with  . Shoulder Pain    (Consider location/radiation/quality/duration/timing/severity/associated sxs/prior treatment) HPI Comments: Patient presents today with complaints of right shoulder pain, low back pain and right foot pain. She states that this began after a fall in November of 2012 at work. She was employed at the [employer] but since employments has been terminated. She states that her injuries are not being covered by [employer]. She was evaluated here in November 2012 and twice since with complaints of neck and right shoulder pain from the fall.  January 2013 she was referred to an orthopedic physician. Patient states that she called to schedule the followup appointment but was denied a followup appointment because she was uninsured and reports that they would not accept cash payment. Her primary care physician is at [facility], and she states that they will not see her for these complaints because it is a work related injury. Pain is unchanged. Worse with movement. She is currently not taking anything for discomfort.    Past Medical History  Diagnosis Date  . Hypertension   . Diabetes mellitus     History reviewed. No pertinent past surgical history.  History reviewed. No pertinent family history.  History  Substance Use Topics  . Smoking status: Never Smoker   . Smokeless tobacco: Not on file  . Alcohol Use: No    OB History    Grav Para Term Preterm Abortions TAB SAB Ect Mult Living                  Review of Systems  Constitutional: Negative for fever and chills.  HENT: Negative for neck pain.   Musculoskeletal: Positive for back pain. Negative for joint swelling.  Skin: Negative for rash.  Neurological: Negative for numbness.    Allergies  Review of patient's allergies indicates no known allergies.  Home Medications    Current Outpatient Rx  Name Route Sig Dispense Refill  . LISINOPRIL PO Oral Take by mouth.    . METFORMIN HCL PO Oral Take by mouth.    . IBUPROFEN 800 MG PO TABS Oral Take 1 tablet (800 mg total) by mouth 3 (three) times daily. 15 tablet 0    BP 132/75  Pulse 87  Temp(Src) 98.6 F (37 C) (Oral)  Resp 16  SpO2 100%  LMP 04/29/2011  Physical Exam  Nursing note and vitals reviewed. Constitutional: She appears well-developed and well-nourished. No distress.  HENT:  Head: Normocephalic and atraumatic.  Musculoskeletal:       Right shoulder: She exhibits tenderness (AC joint and anterior joint line) and decreased strength. She exhibits normal range of motion, no bony tenderness, no swelling, no effusion, no crepitus, no deformity, no laceration and no spasm.       Right ankle: No head of 5th metatarsal tenderness found.       Lumbar back: She exhibits tenderness (Mild Rt paraspinal muscular TTP). She exhibits normal range of motion, no bony tenderness, no swelling, no edema and no spasm.       Right foot: She exhibits tenderness (across dorsum of foot and MTP joints from 1st metatarsal to 4th). She exhibits normal range of motion, no swelling and no deformity.  Neurological: She is alert. She has normal strength. Gait normal.  Reflex Scores:      Patellar reflexes are 2+ on the  right side and 2+ on the left side. Skin: Skin is warm and dry.  Psychiatric: She has a normal mood and affect.    ED Course  Procedures (including critical care time)  Labs Reviewed - No data to display No results found.   1. Chronic right shoulder pain   2. Low back pain   3. Foot pain, right       MDM  Previous visits reviewed. Chronic Rt shoulder pain x 5 mos since fall injury. Suspect rotator cuff tear. Pt now also reports low back and Rt foot pain x 5 mos since fall injury. Pt to f/u with ortho.          Melody Comas, [place] 06/14/11 2001

## 2012-03-21 DIAGNOSIS — E119 Type 2 diabetes mellitus without complications: Secondary | ICD-10-CM | POA: Insufficient documentation

## 2012-05-28 ENCOUNTER — Other Ambulatory Visit: Payer: Self-pay | Admitting: *Deleted

## 2012-05-28 DIAGNOSIS — R7989 Other specified abnormal findings of blood chemistry: Secondary | ICD-10-CM

## 2012-05-31 ENCOUNTER — Other Ambulatory Visit: Payer: Self-pay

## 2016-04-14 ENCOUNTER — Ambulatory Visit (INDEPENDENT_AMBULATORY_CARE_PROVIDER_SITE_OTHER): Payer: Worker's Compensation

## 2016-04-14 ENCOUNTER — Encounter (HOSPITAL_COMMUNITY): Payer: Self-pay | Admitting: Emergency Medicine

## 2016-04-14 ENCOUNTER — Ambulatory Visit (HOSPITAL_COMMUNITY)
Admission: EM | Admit: 2016-04-14 | Discharge: 2016-04-14 | Disposition: A | Discharge status: Home / Self Care | Payer: Worker's Compensation | Attending: Family Medicine | Admitting: Family Medicine

## 2016-04-14 DIAGNOSIS — S93401A Sprain of unspecified ligament of right ankle, initial encounter: Secondary | ICD-10-CM

## 2016-04-14 DIAGNOSIS — M25551 Pain in right hip: Secondary | ICD-10-CM

## 2016-04-14 LAB — POCT PREGNANCY, URINE: Preg Test, Ur: NEGATIVE

## 2016-04-14 MED ORDER — DICLOFENAC SODIUM 75 MG PO TBEC: 75.0000 mg | DELAYED_RELEASE_TABLET | Freq: Two times a day (BID) | ORAL | 0 refills | Status: DC

## 2016-04-14 NOTE — ED Provider Notes (Signed)
[place]    CSN: [id number] Arrival date & time: 04/14/16  1854     History   Chief Complaint Chief Complaint  Patient presents with  . Ankle Injury    HPI Annette Wolfe is a 41 y.o. female.   History 34-year-old Hispanic woman who twisted her ankle about 2:30 today. She's had some swelling and tenderness over the lateral malleolus. She has no pain in her foot.  She does have some right hip discomfort as well.      Past Medical History:  Diagnosis Date  . Diabetes mellitus   . Hypertension     Patient Active Problem List   Diagnosis Date Noted  . PAP SMEAR, LGSIL, ABNORMAL 01/22/2009  . HYPERCHOLESTEROLEMIA 01/15/2009  . RENAL CALCULUS, LEFT 01/15/2009  . OLIGOMENORRHEA 01/15/2009  . KERATOSIS PILARIS 01/15/2009  . OBESITY 06/05/2008  . CARPAL TUNNEL SYNDROME, RIGHT 06/05/2008  . HYPERGLYCEMIA 04/16/2008  . NECK PAIN, CHRONIC 04/08/2008  . FATIGUE 04/08/2008  . 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 FIBROIDS, UTERUS 04/06/2007  . CYSTITIS 04/06/2007  . OVARIAN MASS 12/28/2006  . PELVIC PAIN, LEFT 12/28/2006  . ALLERGIC RHINITIS 11/02/2006    History reviewed. No pertinent surgical history.  OB History    No data available       Home Medications    Prior to Admission medications   Medication Sig Start Date End Date Taking? Authorizing Provider  LISINOPRIL PO Take by mouth.   Yes Historical Provider, MD  METFORMIN HCL PO Take by mouth.   Yes Historical Provider, MD  diclofenac (VOLTAREN) 75 MG EC tablet Take 1 tablet (75 mg total) by mouth 2 (two) times daily. 04/14/16   Robyn Haber, MD    Family History History reviewed. No pertinent family history.  Social History Social History  Substance Use Topics  . Smoking status: Never Smoker  . Smokeless tobacco: Never Used  . Alcohol use No     Allergies   Patient has no known allergies.   Review of Systems Review of Systems  Constitutional: Negative.     HENT: Negative.   Respiratory: Negative.   Cardiovascular: Negative.   Musculoskeletal: Positive for joint swelling.  Neurological: Negative.      Physical Exam Triage Vital Signs ED Triage Vitals  Enc Vitals Group     BP      Pulse      Resp      Temp      Temp src      SpO2      Weight      Height      Head Circumference      Peak Flow      Pain Score      Pain Loc      Pain Edu?      Excl. in [place]?    No data found.   Updated Vital Signs BP 128/83 (BP Location: Left Arm)   Pulse 93   Temp 99.5 F (37.5 C) (Oral)   Resp 16   LMP 02/14/2016   SpO2 99%    Physical Exam  Constitutional: She is oriented to person, place, and time. She appears well-developed and well-nourished.  HENT:  Head: Normocephalic.  Right Ear: External ear normal.  Left Ear: External ear normal.  Mouth/Throat: Oropharynx is clear and moist.  Eyes: Conjunctivae are normal.  Neck: Normal range of motion. Neck supple.  Pulmonary/Chest: Effort normal.  Musculoskeletal: She exhibits tenderness. She exhibits no deformity.  She has  some tenderness over the lateral malleolus of her right foot. The right foot itself does not show swelling or tenderness nor is there any bony deformity.  Neurological: She is alert and oriented to person, place, and time.  Skin: Skin is warm and dry.  Nursing note and vitals reviewed.    UC Treatments / Results  Labs (all labs ordered are listed, but only abnormal results are displayed) Labs Reviewed  POCT PREGNANCY, URINE    EKG  EKG Interpretation None       Radiology Right ankle:  No fx, mild STS Procedures Procedures (including critical care time)  Medications Ordered in UC Medications - No data to display   Initial Impression / Assessment and Plan / UC Course  I have reviewed the triage vital signs and the nursing notes.  Pertinent labs & imaging results that were available during my care of the patient were reviewed by me and  considered in my medical decision making (see chart for details).     Final Clinical Impressions(s) / UC Diagnoses   Final diagnoses:  Sprain of right ankle, unspecified ligament, initial encounter    New Prescriptions New Prescriptions   DICLOFENAC (VOLTAREN) 75 MG EC TABLET    Take 1 tablet (75 mg total) by mouth 2 (two) times daily.     Robyn Haber, MD 04/14/16 2117

## 2016-05-16 ENCOUNTER — Ambulatory Visit: Payer: Worker's Compensation | Admitting: Physical Therapy

## 2016-05-20 ENCOUNTER — Encounter: Payer: Self-pay | Admitting: Physical Therapy

## 2016-05-20 ENCOUNTER — Ambulatory Visit: Payer: Self-pay | Attending: Family Medicine | Admitting: Physical Therapy

## 2016-05-20 DIAGNOSIS — M25551 Pain in right hip: Secondary | ICD-10-CM | POA: Insufficient documentation

## 2016-05-20 DIAGNOSIS — M545 Low back pain: Secondary | ICD-10-CM | POA: Insufficient documentation

## 2016-05-20 DIAGNOSIS — R262 Difficulty in walking, not elsewhere classified: Secondary | ICD-10-CM | POA: Insufficient documentation

## 2016-05-20 NOTE — Therapy (Signed)
[place], [place], [address] Phone: [phone]   Fax:  [phone]  Physical Therapy Evaluation  Patient Details  Name: Annette Wolfe MRN: [id number] Date of Birth: 07/27/1975 Referring Provider: Candace Gallus, MD  Encounter Date: 05/20/2016      PT End of Session - 05/20/16 0937    Visit Number 1   Number of Visits 7   Date for PT Re-Evaluation 07/08/16   Authorization Type workers comp- 6 visits approved   PT Start Time [phone]  pt and interpreter finishing FOTO   PT Stop Time 1020   PT Time Calculation (min) 44 min   Activity Tolerance Patient tolerated treatment well   Behavior During Therapy [facility] for tasks assessed/performed      Past Medical History:  Diagnosis Date  . Diabetes mellitus   . Hypertension     History reviewed. No pertinent surgical history.  There were no vitals filed for this visit.       Subjective Assessment - 05/20/16 0947    Subjective Pt reports feeling pain when sitting, stinging pain/stabbing. Midline LBP and to R into R hip. Pain when walking for long periods or walking fast. Pain when laying on R. Fell at work 2 months ago with no pain, fall 1 month ago resulted in pain.    Limitations Sitting;Standing;Walking;House hold activities   How long can you sit comfortably? unable to sit comfortably   How long can you stand comfortably? 10 min, fine if no pressure through R foot.   Patient Stated Goals work, decrease pain, knowledge of reason for back pain   Currently in Pain? Yes   Pain Score 7    Pain Location Back   Pain Orientation Lower;Right   Pain Descriptors / Indicators Dull   Pain Type Chronic pain   Pain Onset More than a month ago   Pain Frequency Intermittent   Aggravating Factors  standing, sitting, laying on R   Pain Relieving Factors take a lot of advil            [facility] PT Assessment - 05/20/16 0001      Assessment   Medical  Diagnosis LBP   Referring Provider Candace Gallus, MD   Hand Dominance Right   Next MD Visit 3/5   Prior Therapy no     Precautions   Precautions None     Restrictions   Weight Bearing Restrictions No     Balance Screen   Has the patient fallen in the past 6 months Yes   How many times? 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     Cognition   Overall Cognitive Status Within Functional Limits for tasks assessed     Observation/Other Assessments   Focus on Therapeutic Outcomes (FOTO)  35% ability     Sensation   Additional Comments N/T in R foot and swelling when seated     ROM / Strength   AROM / PROM / Strength AROM;Strength     AROM   Overall AROM Comments low back & hip WFL, all painful     Strength   Overall Strength Comments did not test at eval due to notable pelvic rotation  Strength Assessment Site Hip     Palpation   SI assessment  R SIJ TTP   Palpation comment R innom post rot                   [facility] Adult PT Treatment/Exercise - 05/20/16 0001      Exercises   Exercises Knee/Hip     Knee/Hip Exercises: Stretches   Piriformis Stretch Limitations figure 4  siginificant limitation L     Knee/Hip Exercises: Seated   Ball Squeeze seated ball squeeze for pelvic stability     Knee/Hip Exercises: Supine   Hip Adduction Isometric Limitations iso squeeze on ball   Bridges with [employer] Other (comment)  cues for level pelvis     Manual Therapy   Manual Therapy Muscle Energy Technique   Muscle Energy Technique R post innom followed by iso abd/add and stretched LTR                PT Education - 05/20/16 1138    Education provided Yes   Education Details anatomy of  condition, POC, HEP, exercise form/rationale, return to work, pelvic rotation causing R foot edema   Person(s) Educated Patient   Methods Explanation;Demonstration;Tactile cues;Verbal cues;Handout   Comprehension Verbalized understanding;Returned demonstration;Verbal cues required;Tactile cues required;Need further instruction          PT Short Term Goals - 05/20/16 1146      PT SHORT TERM GOAL #1   Title Pt will demo neutral pelvis by 3/23   Baseline rotated at eval   Time 3   Period Weeks   Status New           PT Long Term Goals - 05/20/16 1148      PT LONG TERM GOAL #1   Title Pt will be able to tolerate a seated position for at least 30 min without discomfort from back by 4/20   Baseline pt unable to tolerate seated position   Time 7   Period Weeks   Status New     PT LONG TERM GOAL #2   Title MMT at hip all 5/5 to indicate proper support to lumbopelvic region by surrounding musculature   Baseline did not test at eval due to notable pelvic rotation   Time 7   Period Weeks   Status New     PT LONG TERM GOAL #3   Title FOTO to 59% ability to indicate significant improvement in functional ability   Baseline 35% ability at eval   Time 7   Period Weeks   Status New     PT LONG TERM GOAL #4   Title Pt will be independent with HEP to continue strengthening and providing support to pelvis during functional activities   Baseline will progress as appropriate    Time 7   Period Weeks   Status New     PT LONG TERM GOAL #5   Title Average pain <=3/10 with all work and daily activities   Baseline 7/10 at eval   Time 7   Period Weeks   Status New               Plan - 05/20/16 1140    Clinical Impression Statement Pt presents to PT with complaints of LBP at midline and to the R that is felt into her R hip, also notable edema in R foot after sitting at work. Pt reports falling about 1 month ago which caused pain. Pt with notable innominate rotation-R  post,  which resulted in functional LLD which was corrected with MET today. Pt verbalized improvement in symtpoms after treatment. Pt is very concerned about returning to work and restrictions. I told her that if an MD gave her restrictions that they would need to be lifted by that particulat MD and that I did not see any reason to place more restrictions on her based on my evaluation. I suggested standing more throughout her day and placing equal weight through her feet to equalize forces through pelvis. pt will benefit from skilled PT in order to decrease pain and return to PLOF.    Rehab Potential Good   PT Frequency 2x / week  pt scheduled 1/week   PT Duration 4 weeks   PT Treatment/Interventions ADLs/Self Care Home Management;Cryotherapy;Electrical Stimulation;Iontophoresis 44m/ml Dexamethasone;Functional mobility training;Stair training;Gait training;Ultrasound;Traction;Moist Heat;Therapeutic activities;Therapeutic exercise;Balance training;Neuromuscular re-education;Patient/family education;Passive range of motion;Manual techniques;Dry needling;Taping   PT Next Visit Plan check pelvic rotation, lumbopelvic stability, manual to QL and piriformis   PT Home Exercise Plan supine ball squeeze, bridge with ball squeeze, ball bw knees at work, stand with equal weight in feet   Consulted and Agree with Plan of Care Patient      Patient will benefit from skilled therapeutic intervention in order to improve the following deficits and impairments:  Difficulty walking, Increased muscle spasms, Decreased activity tolerance, Pain, Improper body mechanics, Impaired flexibility, Decreased strength, Postural dysfunction  Visit Diagnosis: Acute midline low back pain, with sciatica presence unspecified - Plan: PT plan of care cert/re-cert  Pain in right hip - Plan: PT plan of care cert/re-cert  Difficulty in walking, not elsewhere classified - Plan: PT plan of care cert/re-cert     Problem List Patient Active  Problem List   Diagnosis Date Noted  . PAP SMEAR, LGSIL, ABNORMAL 01/22/2009  . HYPERCHOLESTEROLEMIA 01/15/2009  . RENAL CALCULUS, LEFT 01/15/2009  . OLIGOMENORRHEA 01/15/2009  . KERATOSIS PILARIS 01/15/2009  . OBESITY 06/05/2008  . CARPAL TUNNEL SYNDROME, RIGHT 06/05/2008  . HYPERGLYCEMIA 04/16/2008  . NECK PAIN, CHRONIC 04/08/2008  . FATIGUE 04/08/2008  . 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 FIBROIDS, UTERUS 04/06/2007  . CYSTITIS 04/06/2007  . OVARIAN MASS 12/28/2006  . PELVIC PAIN, LEFT 12/28/2006  . ALLERGIC RHINITIS 11/02/2006    Itsel Opfer C. Mirai Greenwood PT, DPT 05/20/16 11:55 AM   [facility]GSh[place] [address]Phone: [phone]  Fax:  [phone] Name: MBRIXTON SCHNAPPMRN: [id number] of Birth: [birth year]

## 2016-05-23 ENCOUNTER — Other Ambulatory Visit: Payer: Self-pay | Admitting: Occupational Medicine

## 2016-05-23 ENCOUNTER — Ambulatory Visit: Payer: Self-pay

## 2016-05-23 DIAGNOSIS — G8929 Other chronic pain: Secondary | ICD-10-CM

## 2016-05-23 DIAGNOSIS — M545 Low back pain: Principal | ICD-10-CM

## 2016-06-03 ENCOUNTER — Ambulatory Visit: Payer: Worker's Compensation | Attending: Internal Medicine | Admitting: Physical Therapy

## 2016-06-03 ENCOUNTER — Encounter: Payer: Self-pay | Admitting: Physical Therapy

## 2016-06-03 DIAGNOSIS — M545 Low back pain: Secondary | ICD-10-CM | POA: Diagnosis present

## 2016-06-03 DIAGNOSIS — R262 Difficulty in walking, not elsewhere classified: Secondary | ICD-10-CM | POA: Insufficient documentation

## 2016-06-03 DIAGNOSIS — M25551 Pain in right hip: Secondary | ICD-10-CM

## 2016-06-03 NOTE — Therapy (Addendum)
[place], [place], [address] Phone: [phone]   Fax:  [phone]  Physical Therapy Treatment/Discharge Summary  Patient Details  Name: Annette Wolfe MRN: [id number] Date of Birth: [birth year] Referring Provider: Candace Gallus, MD  Encounter Date: 06/03/2016      PT End of Session - 06/03/16 1104    Visit Number 2   Number of Visits 7   Date for PT Re-Evaluation 07/08/16   Authorization Type workers comp- 6 visits approved   PT Start Time 1104   PT Stop Time 1147   PT Time Calculation (min) 43 min   Activity Tolerance Patient tolerated treatment well   Behavior During Therapy [facility] for tasks assessed/performed      Past Medical History:  Diagnosis Date  . Diabetes mellitus   . Hypertension     History reviewed. No pertinent surgical history.  There were no vitals filed for this visit.      Subjective Assessment - 06/03/16 1104    Subjective Cont to have pain while working. Feels that pain is not as intense but is still there all the time. Is doing exercises and they help her to feel relaxed.    How long can you sit comfortably? unable to sit comfortably   How long can you stand comfortably? 10 min, fine if no pressure through R foot.   Patient Stated Goals work, decrease pain, knowledge of reason for back pain   Currently in Pain? Yes   Pain Score 7    Pain Location Hip   Pain Orientation Lower;Right   Aggravating Factors  prolonged positions   Pain Relieving Factors exercises.                          [facility] Adult PT Treatment/Exercise - 06/03/16 0001      Knee/Hip Exercises: Stretches   Passive Hamstring Stretch Both;30 seconds   Piriformis Stretch Both;30 seconds   Piriformis Stretch Limitations figure 4   Gastroc Stretch 2 reps;30 seconds   Gastroc Stretch Limitations slant board   Other Knee/Hip Stretches lower trunk rotation     Knee/Hip Exercises:  Aerobic   Nustep L4 5 min     Knee/Hip Exercises: Seated   Sit to [employer] 10 reps;2 sets  ball bw knees     Knee/Hip Exercises: Supine   Bridges with [employer] 15 reps  fee in DF     Knee/Hip Exercises: Sidelying   Clams green tband     Manual Therapy   Manual therapy comments edu in use of tennis ball for trigger point release                PT Education - 06/03/16 1108    Education provided Yes   Education Details exercise form/rationale, tennis ball trigger point release, posture at work   [employer]) Educated Patient   Methods Explanation;Demonstration;Tactile cues;Verbal cues;Handout   Comprehension Verbalized understanding;Returned demonstration;Verbal cues required;Tactile cues required;Need further instruction          PT Short Term Goals - 06/03/16 1430      PT SHORT TERM GOAL #1   Title Pt will demo neutral pelvis by 3/23   Baseline not rotated at visit   Status Achieved           PT Long Term Goals - 05/20/16 1148      PT LONG TERM GOAL #1   Title Pt will be able to tolerate  a seated position for at least 30 min without discomfort from back by 4/20   Baseline pt unable to tolerate seated position   Time 7   Period Weeks   Status New     PT LONG TERM GOAL #2   Title MMT at hip all 5/5 to indicate proper support to lumbopelvic region by surrounding musculature   Baseline did not test at eval due to notable pelvic rotation   Time 7   Period Weeks   Status New     PT LONG TERM GOAL #3   Title FOTO to 59% ability to indicate significant improvement in functional ability   Baseline 35% ability at eval   Time 7   Period Weeks   Status New     PT LONG TERM GOAL #4   Title Pt will be independent with HEP to continue strengthening and providing support to pelvis during functional activities   Baseline will progress as appropriate    Time 7   Period Weeks   Status New     PT LONG TERM GOAL #5   Title Average pain <=3/10 with all work and  daily activities   Baseline 7/10 at eval   Time 7   Period Weeks   Status New               Plan - 06/03/16 1428    Clinical Impression Statement Pt with notable tightness of musculature surrounding R SIJ but did not present with innominate rotation. Trigger points recreated concordant pain and were reduced with tennis ball.    PT Next Visit Plan lumbopelvic stability   PT Home Exercise Plan supine ball squeeze, bridge with ball squeeze, ball bw knees at work, stand with equal weight in feet, clam, figure 4 & hamstring stretch   Consulted and Agree with Plan of Care Patient      Patient will benefit from skilled therapeutic intervention in order to improve the following deficits and impairments:     Visit Diagnosis: Acute midline low back pain, with sciatica presence unspecified  Pain in right hip  Difficulty in walking, not elsewhere classified     Problem List Patient Active Problem List   Diagnosis Date Noted  . PAP SMEAR, LGSIL, ABNORMAL 01/22/2009  . HYPERCHOLESTEROLEMIA 01/15/2009  . RENAL CALCULUS, LEFT 01/15/2009  . OLIGOMENORRHEA 01/15/2009  . KERATOSIS PILARIS 01/15/2009  . OBESITY 06/05/2008  . CARPAL TUNNEL SYNDROME, RIGHT 06/05/2008  . HYPERGLYCEMIA 04/16/2008  . NECK PAIN, CHRONIC 04/08/2008  . FATIGUE 04/08/2008  . 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 FIBROIDS, UTERUS 04/06/2007  . CYSTITIS 04/06/2007  . OVARIAN MASS 12/28/2006  . PELVIC PAIN, LEFT 12/28/2006  . ALLERGIC RHINITIS 11/02/2006    Banjamin Stovall C. Ivone Licht PT, DPT 06/03/16 2:31 PM   [facility] [address] [place], [place], [address] Phone: [phone]   Fax:  [phone]  Name: Annette Wolfe MRN: [id number] Date of Birth: 08-19-1975  PHYSICAL THERAPY DISCHARGE SUMMARY  Visits from Start of Care: 2  Current functional level related to goals / functional outcomes: See above   Remaining deficits: See  above   Education / Equipment: Anatomy of condition, POC, HEP, exercise form/rationale  Plan: Patient agrees to discharge.  Patient goals were not met. Patient is being discharged due to not returning since the last visit.  ?????    Sylvestre Rathgeber C. Ephriam Turman PT, DPT 08/24/16 12:46 PM

## 2016-06-10 ENCOUNTER — Ambulatory Visit: Payer: Worker's Compensation | Attending: Internal Medicine | Admitting: Physical Therapy

## 2016-06-13 ENCOUNTER — Ambulatory Visit: Payer: Worker's Compensation | Admitting: Physical Therapy

## 2016-06-13 ENCOUNTER — Telehealth: Payer: Self-pay | Admitting: Physical Therapy

## 2016-06-13 NOTE — Telephone Encounter (Signed)
Interpreter was utilized to leave VM-pt NS for appt today, has her next one on Friday. Advised that appointments would be cancelled if she NS for 2 appointments. Requested call back. Tkai Serfass C. Kuuipo Anzaldo PT, DPT 06/13/16 1:41 PM

## 2016-06-24 ENCOUNTER — Ambulatory Visit: Payer: Worker's Compensation | Attending: Internal Medicine | Admitting: Physical Therapy

## 2016-07-01 ENCOUNTER — Ambulatory Visit: Payer: Worker's Compensation | Admitting: Physical Therapy

## 2016-07-08 ENCOUNTER — Ambulatory Visit: Payer: Worker's Compensation | Admitting: Physical Therapy

## 2016-09-26 ENCOUNTER — Encounter (HOSPITAL_COMMUNITY): Payer: Self-pay

## 2016-09-26 ENCOUNTER — Ambulatory Visit (HOSPITAL_COMMUNITY)
Admission: EM | Admit: 2016-09-26 | Discharge: 2016-09-26 | Disposition: A | Discharge status: Other Acute Inpatient Hospital | Payer: Self-pay

## 2016-09-26 ENCOUNTER — Emergency Department (HOSPITAL_COMMUNITY)
Admission: EM | Admit: 2016-09-26 | Discharge: 2016-09-26 | Disposition: A | Discharge status: Home / Self Care | Payer: Self-pay | Attending: Emergency Medicine | Admitting: Emergency Medicine

## 2016-09-26 DIAGNOSIS — I10 Essential (primary) hypertension: Secondary | ICD-10-CM | POA: Insufficient documentation

## 2016-09-26 DIAGNOSIS — E119 Type 2 diabetes mellitus without complications: Secondary | ICD-10-CM | POA: Insufficient documentation

## 2016-09-26 DIAGNOSIS — M5441 Lumbago with sciatica, right side: Secondary | ICD-10-CM

## 2016-09-26 DIAGNOSIS — M5431 Sciatica, right side: Secondary | ICD-10-CM | POA: Insufficient documentation

## 2016-09-26 MED ORDER — METHOCARBAMOL 500 MG PO TABS: 500.0000 mg | ORAL_TABLET | Freq: Two times a day (BID) | ORAL | 0 refills | Status: DC

## 2016-09-26 MED ORDER — MELOXICAM 7.5 MG PO TABS: 7.5000 mg | ORAL_TABLET | Freq: Every day | ORAL | 0 refills | Status: DC

## 2016-09-26 NOTE — ED Notes (Signed)
Pt verbalized understanding of d/c instructions and has no further questions. Pt is stable, A&Ox4, VSS.  

## 2016-09-26 NOTE — ED Provider Notes (Signed)
[facility] DEPT Provider Note   CSN: [id number] Arrival date & time: 09/26/16  1935  By signing my name below, I, Marcello Moores, attest that this documentation has been prepared under the direction and in the presence of Deliah Strehlow, PA-C. Electronically Signed: Marcello Moores, ED Scribe. 09/26/16. 11:14 PM.  History   Chief Complaint Chief Complaint  Patient presents with  . Back Pain   The history is provided by the patient. A language interpreter was used.   HPI Comments: Annette Wolfe is a 41 y.o. female, with a h/x of DM, who presents to the Emergency Department complaining of right sided lower back pain with associated "tingling" s/p a mechanical fall that occurred 6 months ago. She states that she was carrying a box at work, when she fell, and "sat" on her leg. She states that the pain radiates down her leg to her knee. The pt reports that worker's compensaton cut her off medicine refill because X-rays could not diagnose fracture. She is requesting an MRI. Getting up and ambulation exacerbates her pain. The pt was prescribed Meloxicam with relief. She has also tried a cool compress with no relief. The pt states that she had back pain due to her "kidney" 4 years ago which is not similar to her current symptoms. The pt denies a h/x of cancer, IV drug use, previous back surgery. The pt denies bladder/bowel incontinence, numbness, trouble walking, weakness, nausea, vomiting, dysuria, and hematuria.   Past Medical History:  Diagnosis Date  . Diabetes mellitus   . Hypertension     Patient Active Problem List   Diagnosis Date Noted  . PAP SMEAR, LGSIL, ABNORMAL 01/22/2009  . HYPERCHOLESTEROLEMIA 01/15/2009  . RENAL CALCULUS, LEFT 01/15/2009  . OLIGOMENORRHEA 01/15/2009  . KERATOSIS PILARIS 01/15/2009  . OBESITY 06/05/2008  . CARPAL TUNNEL SYNDROME, RIGHT 06/05/2008  . HYPERGLYCEMIA 04/16/2008  . NECK PAIN, CHRONIC 04/08/2008  . FATIGUE 04/08/2008  . DYSPHAGIA,  PHARYNGOESOPHAGEAL PHASE 04/08/2008  . FIBROIDS, UTERUS 04/06/2007  . CYSTITIS 04/06/2007  . OVARIAN MASS 12/28/2006  . PELVIC PAIN, LEFT 12/28/2006  . ALLERGIC RHINITIS 11/02/2006    History reviewed. No pertinent surgical history.  OB History    No data available       Home Medications    Prior to Admission medications   Medication Sig Start Date End Date Taking? Authorizing Provider  diclofenac (VOLTAREN) 75 MG EC tablet Take 1 tablet (75 mg total) by mouth 2 (two) times daily. 04/14/16   Robyn Haber, MD  LISINOPRIL PO Take by mouth.    [provider]  meloxicam (MOBIC) 7.5 MG tablet Take 1 tablet (7.5 mg total) by mouth daily. 09/26/16   Quintavious Rinck, PA-C  METFORMIN HCL PO Take by mouth.    [provider]  methocarbamol (ROBAXIN) 500 MG tablet Take 1 tablet (500 mg total) by mouth 2 (two) times daily. 09/26/16   Delia Heady, PA-C    Family History History reviewed. No pertinent family history.  Social History Social History  Substance Use Topics  . Smoking status: Never Smoker  . Smokeless tobacco: Never Used  . Alcohol use No     Allergies   Patient has no known allergies.   Review of Systems Review of Systems  Gastrointestinal: Negative for nausea and vomiting.  Genitourinary: Negative for dysuria and hematuria.  Musculoskeletal: Positive for back pain. Negative for gait problem.  Skin: Negative for color change.  Neurological: Negative for weakness, numbness and headaches.  Psychiatric/Behavioral: Confusion:  Physical Exam Updated Vital Signs BP (!) 158/89 (BP Location: Left Arm)   Pulse 74   Temp 98.5 F (36.9 C) (Oral)   Resp 16   Ht 4\' 11"  (1.499 m)   Wt 68 kg (150 lb)   LMP 09/12/2016 (Exact Date)   SpO2 100%   BMI 30.30 kg/m   Physical Exam  Constitutional: She appears well-developed and well-nourished. No distress.  Nontoxic appearing and not in acute distress. She is ambulatory.  HENT:  Head:  Normocephalic and atraumatic.  Eyes: Conjunctivae and EOM are normal. No scleral icterus.  Neck: Normal range of motion.  Pulmonary/Chest: Effort normal. No respiratory distress.  Musculoskeletal: Normal range of motion. She exhibits tenderness. She exhibits no edema or deformity.       Arms: Tenderness to palpation over right lumbar paraspinal musculature as indicated. No midline spinal tenderness present in lumbar, thoracic or cervical spine. No step-off palpated. No visible bruising, edema or temperature change noted. No objective signs of numbness present. No saddle anesthesia. 2+ DP pulses bilaterally. Sensation intact to light touch. Strength 5/5 in bilateral lower extremities.  Neurological: She is alert.  Skin: No rash noted. She is not diaphoretic.  Psychiatric: She has a normal mood and affect.  Nursing note and vitals reviewed.    ED Treatments / Results   DIAGNOSTIC STUDIES: Oxygen Saturation is 100% on RA, normal by my interpretation.   COORDINATION OF CARE: 10:53 PM-Discussed next steps with pt. Pt verbalized understanding and is agreeable with the plan.   Labs (all labs ordered are listed, but only abnormal results are displayed) Labs Reviewed - No data to display  EKG  EKG Interpretation None       Radiology No results found.  Procedures Procedures (including critical care time)  Medications Ordered in ED Medications - No data to display   Initial Impression / Assessment and Plan / ED Course  I have reviewed the triage vital signs and the nursing notes.  Pertinent labs & imaging results that were available during my care of the patient were reviewed by me and considered in my medical decision making (see chart for details).     Patient presents to ED for right sided back pain and sharp shooting pain down leg for the past 6 months after a mechanical fall. She reports to negative x-rays in the past. Patient requests MRI today.  No neurological  deficits and normal neuro exam.  No saddle anesthesia noted and sensation intact to light touch in bilateral lower extremities. Patient is ambulatory.  No loss of bowel or bladder control.  Low suspicion for cauda equina Or other severe spinal cord injury or abnormality.  No fever, night sweats, weight loss, h/o cancer, IVDA, no recent procedure to back. No urinary symptoms suggestive of UTI. On physical exam she has tenderness to palpation over the right paraspinal lumbar musculature. No collar or temperature change noted. She has normal range of motion of lower extremities. I advised patient that due to her negative workup today and physical exam today and her lack of neurological symptoms, I do not feel as though an MRI is necessary at this point. Patient agrees to being referred to specialist and having refills on her meloxicam. Will give Robaxin for additional relief. I encouraged her to apply heat to area and complete stretches as tolerated. Appears safe for discharge at this time. Follow up as indicated in discharge paperwork.    Final Clinical Impressions(s) / ED Diagnoses   Final diagnoses:  Sciatica of right side  Right-sided low back pain with right-sided sciatica, unspecified chronicity    New Prescriptions Discharge Medication List as of 09/26/2016 11:16 PM    START taking these medications   Details  meloxicam (MOBIC) 7.5 MG tablet Take 1 tablet (7.5 mg total) by mouth daily., Starting Mon 09/26/2016, Print    methocarbamol (ROBAXIN) 500 MG tablet Take 1 tablet (500 mg total) by mouth 2 (two) times daily., Starting Mon 09/26/2016, Print       I personally performed the services described in this documentation, which was scribed in my presence. The recorded information has been reviewed and is accurate.     Delia Heady, PA-C 09/26/16 2339    Fredia Sorrow, MD 09/27/16 [phone]

## 2016-09-26 NOTE — ED Triage Notes (Signed)
Pt endorses lower back pain with tingling in the right leg since falling 6 months ago and states that she wants an MRI. Pt ambulatory and able to move all extremities. Denies urinary incontinence. VSS.

## 2016-09-26 NOTE — Discharge Instructions (Signed)
Take medications as directed. Follow-up with spine specialist this is below for further evaluation. Return to ED for worsening pain, injury, numbness, weakness, trouble walking, loss of bladder function.

## 2016-12-15 ENCOUNTER — Emergency Department (HOSPITAL_COMMUNITY)
Admission: EM | Admit: 2016-12-15 | Discharge: 2016-12-16 | Disposition: A | Discharge status: Home / Self Care | Payer: Self-pay | Attending: Emergency Medicine | Admitting: Emergency Medicine

## 2016-12-15 ENCOUNTER — Encounter (HOSPITAL_COMMUNITY): Payer: Self-pay | Admitting: Emergency Medicine

## 2016-12-15 DIAGNOSIS — Z79899 Other long term (current) drug therapy: Secondary | ICD-10-CM | POA: Insufficient documentation

## 2016-12-15 DIAGNOSIS — N611 Abscess of the breast and nipple: Secondary | ICD-10-CM | POA: Insufficient documentation

## 2016-12-15 DIAGNOSIS — E119 Type 2 diabetes mellitus without complications: Secondary | ICD-10-CM | POA: Insufficient documentation

## 2016-12-15 DIAGNOSIS — I10 Essential (primary) hypertension: Secondary | ICD-10-CM | POA: Insufficient documentation

## 2016-12-15 LAB — COMPREHENSIVE METABOLIC PANEL
ALT: 34 U/L (ref 14–54)
AST: 23 U/L (ref 15–41)
Albumin: 3.5 g/dL (ref 3.5–5.0)
Alkaline Phosphatase: 78 U/L (ref 38–126)
Anion gap: 9 (ref 5–15)
BUN: 9 mg/dL (ref 6–20)
CO2: 25 mmol/L (ref 22–32)
Calcium: 8.9 mg/dL (ref 8.9–10.3)
Chloride: 100 mmol/L — ABNORMAL LOW (ref 101–111)
Creatinine, Ser: 0.6 mg/dL (ref 0.44–1.00)
GFR calc Af Amer: >60 mL/min (ref >60)
GFR calc non Af Amer: >60 mL/min (ref >60)
Glucose, Bld: 278 mg/dL — ABNORMAL HIGH (ref 65–99)
Potassium: 3.9 mmol/L (ref 3.5–5.1)
Sodium: 134 mmol/L — ABNORMAL LOW (ref 135–145)
Total Bilirubin: 0.4 mg/dL (ref 0.3–1.2)
Total Protein: 7.6 g/dL (ref 6.5–8.1)

## 2016-12-15 LAB — CBC WITH DIFFERENTIAL/PLATELET
Basophils Absolute: 0 10*3/uL (ref 0.0–0.1)
Basophils Relative: 0 %
Eosinophils Absolute: 0.2 10*3/uL (ref 0.0–0.7)
Eosinophils Relative: 2 %
HCT: 39.1 % (ref 36.0–46.0)
Hemoglobin: 12.8 g/dL (ref 12.0–15.0)
Lymphocytes Relative: 35 %
Lymphs Abs: 4.8 10*3/uL — ABNORMAL HIGH (ref 0.7–4.0)
MCH: 28.4 pg (ref 26.0–34.0)
MCHC: 32.7 g/dL (ref 30.0–36.0)
MCV: 86.9 fL (ref 78.0–100.0)
Monocytes Absolute: 0.7 10*3/uL (ref 0.1–1.0)
Monocytes Relative: 5 %
Neutro Abs: 8.1 10*3/uL — ABNORMAL HIGH (ref 1.7–7.7)
Neutrophils Relative %: 58 %
Platelets: 269 10*3/uL (ref 150–400)
RBC: 4.5 MIL/uL (ref 3.87–5.11)
RDW: 12.4 % (ref 11.5–15.5)
WBC: 13.8 10*3/uL — ABNORMAL HIGH (ref 4.0–10.5)

## 2016-12-15 LAB — I-STAT BETA HCG BLOOD, ED (MC, WL, AP ONLY): I-stat hCG, quantitative: <5 m[IU]/mL (ref <5)

## 2016-12-15 LAB — I-STAT TROPONIN, ED: Troponin i, poc: 0.01 ng/mL (ref 0.00–0.08)

## 2016-12-15 MED ORDER — CLINDAMYCIN PHOSPHATE 600 MG/50ML IV SOLN
600.0000 mg | Freq: Once | INTRAVENOUS | Status: AC
  Administered 2016-12-15: 600 mg via INTRAVENOUS
  Filled 2016-12-15: qty 50

## 2016-12-15 NOTE — ED Triage Notes (Addendum)
Patient reports right breast swelling with pain radiating to right axilla and right arm for 3 weeks , she added history of bloody discharge at right nipple 3 years ago . Denies injury / mammogram is negative , no fever or chills .

## 2016-12-15 NOTE — ED Notes (Signed)
No answer when called for triage 

## 2016-12-15 NOTE — ED Provider Notes (Signed)
[facility] DEPT Provider Note   CSN: [id number] Arrival date & time: 12/15/16  1646     History   Chief Complaint Chief Complaint  Patient presents with  . Right Breast Swelling    HPI Annette Wolfe is a 41 y.o. female.  41 year old female with a history of hypertension and diabetes presents to the emergency department for progressive right breast swelling over the past 3 weeks. She has had issues with bloody discharge from her nipple over the past 2 years. She has had yearly mammograms which have been reassuring. 3 weeks ago, she noticed redness to her breast with associated chills. Chills have subsided, but pain has worsened. Pain is worse with movement of her right arm. Redness has also spread to include more of her breast. She denies any nipple discharge or bloody drainage over the last 3 weeks. No PCP follow up prior to today. No hx of breast abscess. She believes her symptoms began after going in a swimming pool 3 weeks ago.   The history is provided by the patient. No language interpreter was used.    Past Medical History:  Diagnosis Date  . Diabetes mellitus   . Hypertension     Patient Active Problem List   Diagnosis Date Noted  . PAP SMEAR, LGSIL, ABNORMAL 01/22/2009  . HYPERCHOLESTEROLEMIA 01/15/2009  . RENAL CALCULUS, LEFT 01/15/2009  . OLIGOMENORRHEA 01/15/2009  . KERATOSIS PILARIS 01/15/2009  . OBESITY 06/05/2008  . CARPAL TUNNEL SYNDROME, RIGHT 06/05/2008  . HYPERGLYCEMIA 04/16/2008  . NECK PAIN, CHRONIC 04/08/2008  . FATIGUE 04/08/2008  . 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 FIBROIDS, UTERUS 04/06/2007  . CYSTITIS 04/06/2007  . OVARIAN MASS 12/28/2006  . PELVIC PAIN, LEFT 12/28/2006  . ALLERGIC RHINITIS 11/02/2006    History reviewed. No pertinent surgical history.  OB History    No data available       Home Medications    Prior to Admission medications   Medication Sig Start Date End Date Taking? Authorizing Provider   clindamycin (CLEOCIN) 150 MG capsule Take 2 capsules (300 mg total) by mouth 3 (three) times daily. May dispense as 150mg  capsules 12/16/16   Antonietta Breach, PA-C  diclofenac (VOLTAREN) 75 MG EC tablet Take 1 tablet (75 mg total) by mouth 2 (two) times daily. 04/14/16   Robyn Haber, MD  HYDROcodone-acetaminophen (NORCO/VICODIN) 5-325 MG tablet Take 1-2 tablets by mouth every 6 (six) hours as needed for severe pain. 12/16/16   Antonietta Breach, PA-C  LISINOPRIL PO Take by mouth.    [provider]  meloxicam (MOBIC) 7.5 MG tablet Take 1 tablet (7.5 mg total) by mouth daily. 09/26/16   Khatri, Hina, PA-C  METFORMIN HCL PO Take by mouth.    [provider]  methocarbamol (ROBAXIN) 500 MG tablet Take 1 tablet (500 mg total) by mouth 2 (two) times daily. 09/26/16   Delia Heady, PA-C    Family History No family history on file.  Social History Social History  Substance Use Topics  . Smoking status: Never Smoker  . Smokeless tobacco: Never Used  . Alcohol use No     Allergies   Patient has no known allergies.   Review of Systems Review of Systems Ten systems reviewed and are negative for acute change, except as noted in the HPI.    Physical Exam Updated Vital Signs BP 132/79   Pulse 94   Temp 98.8 F (37.1 C) (Oral)   Resp 16   Ht 4\' 9"  (1.448 m)  Wt 71.7 kg (158 lb)   LMP 11/03/2016 (Approximate)   SpO2 99%   BMI 34.19 kg/m   Physical Exam  Constitutional: She is oriented to person, place, and time. She appears well-developed and well-nourished. No distress.  Nontoxic appearing and in NAD  HENT:  Head: Normocephalic and atraumatic.  Eyes: Conjunctivae and EOM are normal. No scleral icterus.  Neck: Normal range of motion.  Cardiovascular: Normal rate, regular rhythm and intact distal pulses.   Pulmonary/Chest: Effort normal. No respiratory distress. She has no wheezes.  Respirations even and unlabored. Large abscess to right breast with mild nipple  inversion. Erythema is blanching. Area of abscess is indurated. No red linear streaking. See image below.  Musculoskeletal: Normal range of motion.  Neurological: She is alert and oriented to person, place, and time. She exhibits normal muscle tone. Coordination normal.  Skin: Skin is warm and dry. No rash noted. She is not diaphoretic. No erythema. No pallor.  Psychiatric: She has a normal mood and affect. Her behavior is normal.  Nursing note and vitals reviewed.    ED Treatments / Results  Labs (all labs ordered are listed, but only abnormal results are displayed) Labs Reviewed  CBC WITH DIFFERENTIAL/PLATELET - Abnormal; Notable for the following:       Result Value   WBC 13.8 (*)    Neutro Abs 8.1 (*)    Lymphs Abs 4.8 (*)    All other components within normal limits  COMPREHENSIVE METABOLIC PANEL - Abnormal; Notable for the following:    Sodium 134 (*)    Chloride 100 (*)    Glucose, Bld 278 (*)    All other components within normal limits  I-STAT TROPONIN, ED  I-STAT BETA HCG BLOOD, ED ([facility], [facility], [facility] ONLY)    EKG  EKG Interpretation  Date/Time:  Thursday December 15 2016 19:18:02 EDT Ventricular Rate:  95 PR Interval:  134 QRS Duration: 84 QT Interval:  352 QTC Calculation: 442 R Axis:   49 Text Interpretation:  Normal sinus rhythm nonspecific T wave flattening No old tracing to compare Confirmed by Sherwood Gambler [phone]) on 12/15/2016 11:01:34 PM       Radiology No results found.  Procedures Procedures (including critical care time)  Medications Ordered in ED Medications  clindamycin (CLEOCIN) IVPB 600 mg (0 mg Intravenous Stopped 12/15/16 2329)          Initial Impression / Assessment and Plan / ED Course  I have reviewed the triage vital signs and the nursing notes.  Pertinent labs & imaging results that were available during my care of the patient were reviewed by me and considered in my medical decision making (see chart for details).       41 year old female presents for 3 weeks of a worsening breast abscess. Patient afebrile in the ED. Leukocytosis c/w infectious etiology. Patient does not meet SIRS or SEPSIS criteria. She has been managed with IV abx. Patient will require urgent follow up with the [facility] for drainage of her abscess; referral given. Plan to discharge on Clindamycin. Return precautions discussed and provided. Patient discharged in stable condition with no unaddressed concerns.   Vitals:   12/15/16 2145 12/15/16 2300 12/15/16 2330 12/15/16 2345  BP: 120/68 122/83 124/88 132/79  Pulse: 90 87 92 94  Resp:      Temp:      TempSrc:      SpO2: 98% 98% 98% 99%  Weight:      Height:  Final Clinical Impressions(s) / ED Diagnoses   Final diagnoses:  Breast abscess of female    New Prescriptions New Prescriptions   CLINDAMYCIN (CLEOCIN) 150 MG CAPSULE    Take 2 capsules (300 mg total) by mouth 3 (three) times daily. May dispense as 150mg  capsules   HYDROCODONE-ACETAMINOPHEN (NORCO/VICODIN) 5-325 MG TABLET    Take 1-2 tablets by mouth every 6 (six) hours as needed for severe pain.     Antonietta Breach, PA-C 12/16/16 0015    Sherwood Gambler, MD 12/17/16 [phone]

## 2016-12-16 MED ORDER — CLINDAMYCIN HCL 150 MG PO CAPS: 300.0000 mg | ORAL_CAPSULE | Freq: Three times a day (TID) | ORAL | 0 refills | Status: DC

## 2016-12-16 MED ORDER — HYDROCODONE-ACETAMINOPHEN 5-325 MG PO TABS: 1.0000 | ORAL_TABLET | Freq: Four times a day (QID) | ORAL | 0 refills | Status: DC | PRN

## 2016-12-16 NOTE — Discharge Instructions (Signed)
[place] por la maana para programar el seguimiento; Sin embargo, es posible que pueda caminar para recibir tratamiento sin una cita. Deberan poder drenar su absceso en [employer] de [place]. Tome los antibiticos recetados hasta que termine. Le recomendamos que tome este antibitico con un probitico que se puede comprar sin receta mdica. Tome Norco segn sea necesario para Financial controller. No conduzca ni beba alcohol despus de tomar [employer], ya que puede causar somnolencia. Haga un seguimiento con su mdico de atencin primaria para volver a [employer]. Regrese al servicio de urgencias por empeoramiento de los sntomas o [place].  Call the Breast Center in the morning to schedule follow up; however you may be able to walk in for treatment without an appointment. They should be able to drain your abscess at the Breast Center. Take the antibiotics prescribed to you until finished. We recommend that you take this antibiotic with a probiotic that can be purchased over the counter. Take Norco as needed for pain management. Do not drive or drink alcohol after taking this medication as it may make you drowsy. Follow up with your primary care doctor for recheck of symptoms. Return to the ED for worsening symptoms or fever.

## 2016-12-16 NOTE — ED Notes (Signed)
Patient able to ambulate independently  

## 2016-12-22 ENCOUNTER — Other Ambulatory Visit: Payer: Self-pay | Admitting: Family Medicine

## 2016-12-23 ENCOUNTER — Other Ambulatory Visit (HOSPITAL_COMMUNITY): Payer: Self-pay | Admitting: *Deleted

## 2016-12-23 DIAGNOSIS — N6310 Unspecified lump in the right breast, unspecified quadrant: Secondary | ICD-10-CM

## 2016-12-27 ENCOUNTER — Ambulatory Visit (INDEPENDENT_AMBULATORY_CARE_PROVIDER_SITE_OTHER): Payer: Self-pay | Admitting: Internal Medicine

## 2016-12-27 ENCOUNTER — Encounter: Payer: Self-pay | Admitting: Internal Medicine

## 2016-12-27 VITALS — BP 138/86 | HR 72 | Resp 12 | Ht <= 58 in | Wt 158.0 lb

## 2016-12-27 DIAGNOSIS — E119 Type 2 diabetes mellitus without complications: Secondary | ICD-10-CM

## 2016-12-27 DIAGNOSIS — I10 Essential (primary) hypertension: Secondary | ICD-10-CM

## 2016-12-27 DIAGNOSIS — E7800 Pure hypercholesterolemia, unspecified: Secondary | ICD-10-CM

## 2016-12-27 DIAGNOSIS — N611 Abscess of the breast and nipple: Secondary | ICD-10-CM

## 2016-12-27 LAB — GLUCOSE, POCT (MANUAL RESULT ENTRY): POC Glucose: 399 mg/dl — AB (ref 70–99)

## 2016-12-27 MED ORDER — GLIPIZIDE 5 MG PO TABS: ORAL_TABLET | ORAL | 11 refills | Status: DC

## 2016-12-27 MED ORDER — METFORMIN HCL ER 500 MG PO TB24: ORAL_TABLET | ORAL | 11 refills | Status: DC

## 2016-12-27 MED ORDER — LISINOPRIL 20 MG PO TABS: 20.0000 mg | ORAL_TABLET | Freq: Every day | ORAL | 11 refills | Status: DC

## 2016-12-27 MED ORDER — HYDROCODONE-ACETAMINOPHEN 5-325 MG PO TABS: 1.0000 | ORAL_TABLET | Freq: Four times a day (QID) | ORAL | 0 refills | Status: DC | PRN

## 2016-12-27 MED ORDER — GLUCOSE BLOOD VI STRP: ORAL_STRIP | 12 refills | Status: AC

## 2016-12-27 MED ORDER — CLINDAMYCIN HCL 150 MG PO CAPS: 300.0000 mg | ORAL_CAPSULE | Freq: Three times a day (TID) | ORAL | 0 refills | Status: DC

## 2016-12-27 MED ORDER — AGAMATRIX PRESTO W/DEVICE KIT: PACK | 0 refills | Status: DC

## 2016-12-27 NOTE — Progress Notes (Signed)
   Subjective:    Patient ID: Annette Wolfe, female    DOB: [birth year], 41 y.o.   MRN: [id number]  HPI  Here to establish  Previously followed her years ago at [facility]  1.  Right breast pain for 8 weeks with redness and swelling.  Diagnosed end of September with breast abscess and cellulitis at [facility].  Was set up for mammogram and ultrasound for this Thursday (2 days).  2.  Essential Hypertension: For 4 years. Lisinopril 20 mg daily.  Has controlled bp well.  3.  DM:  diagnosed 4 years ago.  Taking Metformin 500 mg 4 times daily.  If she takes 2 twice daily, has diarrhea. Also takes Glipizide XL 5 mg daily.  Currently taking with midday meal. Actually stopped all her meds when treated with Clindamycin as she did not know if it was safe to take them together.   4.  Hyperlipidemia:  Never treated.     Current Meds  Medication Sig  . diclofenac (VOLTAREN) 75 MG EC tablet Take 1 tablet (75 mg total) by mouth 2 (two) times daily.  Marland Kitchen glipiZIDE (GLUCOTROL XL) 5 MG 24 hr tablet Take 5 mg by mouth daily with breakfast.  . HYDROcodone-acetaminophen (NORCO/VICODIN) 5-325 MG tablet Take 1-2 tablets by mouth every 6 (six) hours as needed for severe pain.  . Ibuprofen (ADVIL) 200 MG CAPS Take by mouth 4 (four) times daily as needed.  Marland Kitchen lisinopril (PRINIVIL,ZESTRIL) 20 MG tablet Take 20 mg by mouth.  . metFORMIN (GLUCOPHAGE) 500 MG tablet Take by mouth 4 (four) times daily.   No Known Allergies   Review of Systems     Objective:   Physical Exam NAD HEENT:  PERRL EOMI, TMs pearly gray, throat without injection Neck:  Supple, No adenopathy Chest:  CTA Right Breast:  6 cm in diameter indurated circular area with erythema superior and deep to areola.  Tender.  No opening or discharge.  Unable to feel fluctuance, but feels like a very well circumscribed round inflammatory mass with thickened walls.   CV: RRR with normal S1 and S2, No S3, S4 or murmur.  Radial and DP  pulses normal and equal. Abd:  S, NT, No HSM or mass, + BS LE:  No edema       Assessment & Plan:  1.  Right breast abscess/cellulitis:  Restart Clindamycin at 300 mg 3 times daily for 7 days until can get to Ultrasound.  Attempted to call Breast Center to discuss likely need for surgical or interventional procedure, but unable to get through today. Hydrocodone in preparation for intervention refilled.  2.  DM:  Switch to Metformin ER once she has an orange card and take 2 tabs twice daily to avoid diarrhea.  Glipizide 5 mg 1/2 tab twice daily for lower cost from the XL version. A1C and urine microalbumin/crea  3.  Hypertension:  Controlled on Lisinopril--refilled with [facility].  4.  Hyperlipidemia:  Will check once she is stable on meds for DM.

## 2016-12-27 NOTE — Patient Instructions (Signed)

## 2016-12-27 NOTE — Progress Notes (Signed)
LCSW completed new pt screening with patient. Patient shared that she does not have any mental health concerns for herself, as she is a very "positive thinker." She shared that she is very concerned about her 40 year old daughter who accompanied her to the appointment. She reported that her daughter has had behavioral concerns like leaving without permission and threatening to kill herself. She reported that her daughter attempted to kill herself two years ago. Patient shared that she is overprotective of her daughter due to her concerns about the behaviors. Counseling session scheduled with daughter 10/24. LCSW and family also discussed possibility of family counseling to address relationship issues, or possibility of individual counseling with patient.

## 2016-12-28 LAB — MICROALBUMIN / CREATININE URINE RATIO
Creatinine, Urine: 51.1 mg/dL
Microalb/Creat Ratio: 8.6 mg/g creat (ref 0.0–30.0)
Microalbumin, Urine: 4.4 ug/mL

## 2016-12-28 LAB — HGB A1C W/O EAG: Hgb A1c MFr Bld: 11.8 % — ABNORMAL HIGH (ref 4.8–5.6)

## 2016-12-29 ENCOUNTER — Ambulatory Visit
Admission: RE | Admit: 2016-12-29 | Discharge: 2016-12-29 | Disposition: A | Discharge status: Home / Self Care | Payer: No Typology Code available for payment source | Source: Ambulatory Visit | Attending: Obstetrics and Gynecology | Admitting: Obstetrics and Gynecology

## 2016-12-29 ENCOUNTER — Encounter (HOSPITAL_COMMUNITY): Payer: Self-pay

## 2016-12-29 ENCOUNTER — Ambulatory Visit
Admission: RE | Admit: 2016-12-29 | Discharge: 2016-12-29 | Disposition: A | Discharge status: Home / Self Care | Payer: Self-pay | Source: Ambulatory Visit | Attending: Obstetrics and Gynecology | Admitting: Obstetrics and Gynecology

## 2016-12-29 ENCOUNTER — Ambulatory Visit (HOSPITAL_COMMUNITY)
Admission: RE | Admit: 2016-12-29 | Discharge: 2016-12-29 | Disposition: A | Discharge status: Home / Self Care | Payer: Self-pay | Source: Ambulatory Visit | Attending: Obstetrics and Gynecology | Admitting: Obstetrics and Gynecology

## 2016-12-29 ENCOUNTER — Other Ambulatory Visit (HOSPITAL_COMMUNITY): Payer: Self-pay | Admitting: Obstetrics and Gynecology

## 2016-12-29 VITALS — BP 144/102 | Temp 98.1°F | Ht <= 58 in | Wt 158.0 lb

## 2016-12-29 DIAGNOSIS — R599 Enlarged lymph nodes, unspecified: Secondary | ICD-10-CM

## 2016-12-29 DIAGNOSIS — N6310 Unspecified lump in the right breast, unspecified quadrant: Secondary | ICD-10-CM

## 2016-12-29 DIAGNOSIS — Z01419 Encounter for gynecological examination (general) (routine) without abnormal findings: Secondary | ICD-10-CM

## 2016-12-29 NOTE — Progress Notes (Signed)
Complaints of right breast lump, redness, swelling, and pain x 8 weeks. Patient states the pain is constant. Patient rates the pain at a 7 out of 10. Patient has taken antibiotics with only a slight improvement.   Pap Smear: Pap smear completed today. Last Pap smear was 11/11/2004 at normal. Per patient has a history of an abnormal Pap smear in 2015 that a repeat Pap smear was completed for follow up that was normal. Last Pap smear result is in EPIC.  Physical exam: Breasts Right breast slightly larger than left breast. No skin abnormalities left breast. Right breast red and warm to the touch. No nipple retraction bilateral breasts. No nipple discharge bilateral breasts. No lymphadenopathy. No lumps palpated left breast. Palpated a mass within the right center breast. Complaints of tenderness when palpated mass. Referred patient to the [facility] for a diagnostic mammogram and right breast ultrasound. Appointment scheduled for Thursday, December 29, 2016 at 1300.  Pelvic/Bimanual   Ext Genitalia No lesions, no swelling and no discharge observed on external genitalia.         Vagina Vagina pink and normal texture. No lesions or discharge observed in vagina.          Cervix Cervix is present. Cervix pink and of normal texture. No discharge observed.     Uterus Uterus is present and palpable. Uterus in normal position and normal size.        Adnexae Bilateral ovaries present and palpable. No tenderness on palpation.          Rectovaginal No rectal exam completed today since patient had no rectal complaints. No skin abnormalities observed on exam.    Smoking History: Patient has never smoked.  Patient Navigation: Patient education provided. Access to services provided for patient through [facility] program. Spanish interpreter provided.  Used Spanish interpreter [employer] from [place].

## 2016-12-29 NOTE — Patient Instructions (Signed)
Explained breast self awareness with Michelene Gardener Prieto-Soto. Let patient know that if today's Pap smear is normal then her next Pap smear will be due in one year due to her history of only one normal Pap smear since abnormal per patient. Referred patient to the [facility] for a diagnostic mammogram and right breast ultrasound. Appointment scheduled for Thursday, December 29, 2016 at 1300. Let patient know will follow up with her within the next couple weeks with results of Pap smear by phone. Doloros Kwolek Prieto-Soto verbalized understanding.  Jenet Durio, Arvil Chaco, RN 2:10 PM

## 2016-12-30 ENCOUNTER — Encounter (HOSPITAL_COMMUNITY): Payer: Self-pay | Admitting: *Deleted

## 2017-01-06 ENCOUNTER — Telehealth: Payer: Self-pay | Admitting: Internal Medicine

## 2017-01-06 ENCOUNTER — Encounter (HOSPITAL_COMMUNITY): Payer: Self-pay | Admitting: *Deleted

## 2017-01-06 ENCOUNTER — Other Ambulatory Visit: Payer: Self-pay

## 2017-01-06 LAB — CYTOLOGY - PAP
Diagnosis: NEGATIVE
HPV: NOT DETECTED

## 2017-01-06 MED ORDER — CLINDAMYCIN HCL 150 MG PO CAPS: 300.0000 mg | ORAL_CAPSULE | Freq: Three times a day (TID) | ORAL | 0 refills | Status: DC

## 2017-01-06 NOTE — Telephone Encounter (Signed)
Patient returned call and stated is feeling much better and also stated has antibiotics left just for tomorrow. Patient aware needs an appoinment for Rx medication. Appt. Made for 01/13/17 at 2:30 p.m.

## 2017-01-06 NOTE — Progress Notes (Signed)
Letter mailed to patient with negative pap smear results. HPV was negative. Next pap smear due in one year.  

## 2017-01-06 NOTE — Telephone Encounter (Signed)
Per Dr. Amil Amen have patient go on another 7 days of antibiotics and will follow up here in the office on 01/13/17. Antony Madura called patient and no answer and no voicemail.

## 2017-01-06 NOTE — Telephone Encounter (Signed)
Rx has been sent to [facility] [address]

## 2017-01-06 NOTE — Telephone Encounter (Signed)
Antony Madura was trying to contact patient  to collect more information regarding her inflammation and the Rx on antibiotics. Antony Madura was unable to LVM.

## 2017-01-11 ENCOUNTER — Ambulatory Visit: Payer: Self-pay | Admitting: Internal Medicine

## 2017-01-13 ENCOUNTER — Ambulatory Visit: Payer: Self-pay | Admitting: Internal Medicine

## 2017-01-20 ENCOUNTER — Ambulatory Visit (INDEPENDENT_AMBULATORY_CARE_PROVIDER_SITE_OTHER): Payer: Self-pay | Admitting: Internal Medicine

## 2017-01-20 ENCOUNTER — Encounter: Payer: Self-pay | Admitting: Internal Medicine

## 2017-01-20 VITALS — BP 130/88 | HR 74 | Resp 12 | Ht <= 58 in | Wt 157.0 lb

## 2017-01-20 DIAGNOSIS — E119 Type 2 diabetes mellitus without complications: Secondary | ICD-10-CM

## 2017-01-20 DIAGNOSIS — N6011 Diffuse cystic mastopathy of right breast: Secondary | ICD-10-CM

## 2017-01-20 LAB — GLUCOSE, POCT (MANUAL RESULT ENTRY): POC Glucose: 234 mg/dl — AB (ref 70–99)

## 2017-01-20 MED ORDER — CLINDAMYCIN HCL 150 MG PO CAPS: ORAL_CAPSULE | ORAL | 0 refills | Status: DC

## 2017-01-20 NOTE — Addendum Note (Signed)
Addended by: Marcelino Duster on: 01/20/2017 11:47 AM   Modules accepted: Orders

## 2017-01-20 NOTE — Progress Notes (Signed)
   Subjective:    Patient ID: Annette Wolfe, female    DOB: June 29, 1975, 41 y.o.   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  Biopsies just showed mixed inflammation and benign reactive changes of axillary node core biopsy.  She states her breast is 90 % better.  Has a little bit of inflammation left.  Finished last of Clindamycin 2 weeks ago.  Did not make her appointment at end of treatment.  Later, she recognizes she never took the second go around of Clindamycin, just the prescription from Urgent care and my initial 7 day extension for total of 17 day course.  She states when she stopped the mass was about 1-2 cm in diameter Was to follow up at end of last course, but called an cancelled at last minute.  Current Meds  Medication Sig  . glipiZIDE (GLUCOTROL) 5 MG tablet 1/2 tab by mouth twice daily with meals  . Ibuprofen (ADVIL) 200 MG CAPS Take by mouth 4 (four) times daily as needed.  Marland Kitchen lisinopril (PRINIVIL,ZESTRIL) 20 MG tablet Take 1 tablet (20 mg total) by mouth daily.  . metFORMIN (GLUCOPHAGE-XR) 500 MG 24 hr tablet 2 tabs by mouth twice daily with meals   Review of Systems     Objective:   Physical Exam Right breast with significant decrease in superficial skin changes--no longer with thickening of skin.  Erythema of skin resolved.   Does have palpable 6 cm mass in deeper tissues with mild tenderness.  No fluctuance. This is the mass patient states got down to 1-2 cm (she shows me the diameter it was) and since stopping antibiotics, has enlarged again.          Assessment & Plan:  Right breast mastitis/phlegmon:  Restart Clindamycin, watching for fever, diarrhea.  Warm pack the area 1/2 hour after each dose of 300 mg 3 times daily for 14 days. To followup in latter days of treatment

## 2017-01-24 ENCOUNTER — Encounter (HOSPITAL_COMMUNITY): Payer: Self-pay | Admitting: *Deleted

## 2017-01-24 NOTE — Progress Notes (Signed)
Letter mailed with negative pap smear results.

## 2017-02-03 ENCOUNTER — Ambulatory Visit: Payer: Self-pay | Admitting: Internal Medicine

## 2017-02-17 ENCOUNTER — Ambulatory Visit: Payer: Self-pay | Admitting: Internal Medicine

## 2017-02-17 ENCOUNTER — Encounter: Payer: Self-pay | Admitting: Internal Medicine

## 2017-02-17 VITALS — BP 122/78 | HR 74 | Resp 12 | Ht <= 58 in | Wt 160.0 lb

## 2017-02-17 DIAGNOSIS — N6011 Diffuse cystic mastopathy of right breast: Secondary | ICD-10-CM

## 2017-02-17 DIAGNOSIS — E119 Type 2 diabetes mellitus without complications: Secondary | ICD-10-CM

## 2017-02-17 DIAGNOSIS — B373 Candidiasis of vulva and vagina: Secondary | ICD-10-CM

## 2017-02-17 DIAGNOSIS — B3731 Acute candidiasis of vulva and vagina: Secondary | ICD-10-CM

## 2017-02-17 DIAGNOSIS — R739 Hyperglycemia, unspecified: Secondary | ICD-10-CM

## 2017-02-17 LAB — GLUCOSE, POCT (MANUAL RESULT ENTRY): POC Glucose: 494 mg/dl — AB (ref 70–99)

## 2017-02-17 MED ORDER — METFORMIN HCL ER 500 MG PO TB24: ORAL_TABLET | ORAL | 11 refills | Status: AC

## 2017-02-17 MED ORDER — GLIPIZIDE 10 MG PO TABS: ORAL_TABLET | ORAL | 3 refills | Status: AC

## 2017-02-17 MED ORDER — FLUCONAZOLE 150 MG PO TABS: ORAL_TABLET | ORAL | 0 refills | Status: DC

## 2017-02-17 MED ORDER — LISINOPRIL 20 MG PO TABS: 20.0000 mg | ORAL_TABLET | Freq: Every day | ORAL | 3 refills | Status: AC

## 2017-02-17 NOTE — Progress Notes (Signed)
   Subjective:    Patient ID: Annette Wolfe, female    DOB: Nov 08, 1975, 41 y.o.   MRN: [id number]  HPI   1.  Right breast much better, but still something palpable there.   She has been off medication now again for 2 weeks as her appointment got rescheduled and she could not come earlier with her work schedule.  2.  Having some perianal irritation since on Clindamycin.  Some itching.  No vaginal itching or discharge.  Sugars have not been well controlled   3.  DM:  Not eating in a healthy manner--eats large portions and foods with high starches/carbs. Adult daughter with her today discusses how her mother eats.  She is also not very physically active.  Sounds like the whole family could work on this together.  Current Meds  Medication Sig  . glipiZIDE (GLUCOTROL) 10 MG tablet 1 tab by mouth twice daily with meals  . lisinopril (PRINIVIL,ZESTRIL) 20 MG tablet Take 1 tablet (20 mg total) by mouth daily.  . metFORMIN (GLUCOPHAGE-XR) 500 MG 24 hr tablet 2 tabs by mouth twice daily with meals  . [DISCONTINUED] glipiZIDE (GLUCOTROL) 5 MG tablet 1/2 tab by mouth twice daily with meals  . [DISCONTINUED] lisinopril (PRINIVIL,ZESTRIL) 20 MG tablet Take 1 tablet (20 mg total) by mouth daily.  . [DISCONTINUED] metFORMIN (GLUCOPHAGE-XR) 500 MG 24 hr tablet 2 tabs by mouth twice daily with meals      Review of Systems     Objective:   Physical Exam  NAD Right Breast:  No overlying erythema, but still with palpable 5 cm round lesion in right breast.  Minimally tender.  No fluctuance.  Vulvar area, perineum, perianal area with significant inflammation, wetness    Assessment & Plan:  1.  Right breast phlegmon:  Patient states this is what occurred last episode of this and took many weeks to months for inflammatory tissue to resolve.   Will discuss with breast surgeon as to when she should have surgical intervention.   This has already been biopsied and found to be just inflammatory  tissue. Again, this occurred a couple of years ago in same area. No further antibiotics for now.  2.  Yeast vaginitis involving perineum and perianal area:  Fluconazole 150 mg by mouth daily for 3 days.  To call if does not resolve.  Discussed controlling her DM is important in treating this as well.  3.  DM:  Increase Glipizide to 10 mg twice daily.  Continue Metformin XR 1000 mg twice daily.  Strongly counseled patient and her daughter to get started as a family on lifestyle changes for weight loss and better DM control. Spent over an hour face to face with patient and daughter on this concern alone. Discussed documenting her sugars twice daily before meals Out of influenza vaccine--to call in a couple of weeks to see if more in.  Follow up in 2 months.

## 2017-02-17 NOTE — Patient Instructions (Signed)

## 2017-04-28 ENCOUNTER — Ambulatory Visit: Payer: Self-pay | Admitting: Internal Medicine

## 2017-05-19 ENCOUNTER — Encounter: Payer: Self-pay | Admitting: Internal Medicine

## 2017-05-19 ENCOUNTER — Ambulatory Visit: Payer: Self-pay | Admitting: Internal Medicine

## 2017-05-19 VITALS — BP 122/80 | HR 70 | Resp 12 | Ht <= 58 in | Wt 156.0 lb

## 2017-05-19 DIAGNOSIS — E119 Type 2 diabetes mellitus without complications: Secondary | ICD-10-CM

## 2017-05-19 DIAGNOSIS — B373 Candidiasis of vulva and vagina: Secondary | ICD-10-CM

## 2017-05-19 DIAGNOSIS — B372 Candidiasis of skin and nail: Secondary | ICD-10-CM

## 2017-05-19 DIAGNOSIS — B3731 Acute candidiasis of vulva and vagina: Secondary | ICD-10-CM

## 2017-05-19 DIAGNOSIS — N6011 Diffuse cystic mastopathy of right breast: Secondary | ICD-10-CM

## 2017-05-19 LAB — POCT WET PREP WITH KOH
Clue Cells Wet Prep HPF POC: NEGATIVE
KOH Prep POC: NEGATIVE
RBC Wet Prep HPF POC: NEGATIVE
Trichomonas, UA: NEGATIVE

## 2017-05-19 LAB — GLUCOSE, POCT (MANUAL RESULT ENTRY): POC Glucose: 237 mg/dl — AB (ref 70–99)

## 2017-05-19 MED ORDER — FLUCONAZOLE 150 MG PO TABS: ORAL_TABLET | ORAL | 0 refills | Status: DC

## 2017-05-19 NOTE — Progress Notes (Signed)
   Subjective:    Patient ID: Annette Wolfe, female    DOB: 04-07-1975, 42 y.o.   MRN: [id number]  HPI  1.  Right breast inflammation:  She feels this is back to normal again.  She does admit this all seems to start with her right nipple starts to have flaking and itching and then oozing.  She intermittently treats with over the counter antibiotic ointment  2.  DM:  Last A1C was 11.8 in October.  She is no longer drinking soda or sugary drinks.  Has lost 4 lbs.  Does not know how to use her glucometer.  No one has read the directions.  Her daughter reads and speaks [place] fluently.  3.  She is having vaginal itching again and is wondering if she can be treated for yeast.. No discharge. No recent antibiotics  Current Meds  Medication Sig  . glipiZIDE (GLUCOTROL) 10 MG tablet 1 tab by mouth twice daily with meals  . lisinopril (PRINIVIL,ZESTRIL) 20 MG tablet Take 1 tablet (20 mg total) by mouth daily.  . metFORMIN (GLUCOPHAGE-XR) 500 MG 24 hr tablet 2 tabs by mouth twice daily with meals    No Known Allergies   Review of Systems     Objective:   Physical Exam  NAD Lungs:  CTA CV:  RRR without murmur or rub, radial pulses normal and equal. Right breast:  The large hard soft tissue mass is no longer palpable, but still with thickening of tissue in same area.  No erythema.  Right nipple with flaking, fissuring and a bit of oozing.  No pustular discharge.  Wet prep/KOH:  Not a good self swab specimen--patient had to leave so only with this to go by.      Assessment & Plan:  1.  Right recurrent mastitis:  Perhaps the beginning of this starts with a fungal infection of nipple.  To apply Terbinafine OTC twice daily to the nipple.  She will be taking Fluconazole 150 daily for 3 days as well.  2.  Yeast vaginitis:  Fluconazole 150 mg daily for 3 days as above.  3.  DM:  States she feels she is doing better.  She was shown how to work her glucometer, but needs to set the proper  time.  Her daughter will read instructions on setting that. A1C today. She needs to follow up regarding her DM specifically and not just for illness.  She needed to leave today to get her daughter to work on time. To come fasting in 3 months for FLP, A1C, urine microalbumin/crea.

## 2017-05-19 NOTE — Patient Instructions (Signed)
Terbinafine cream to right nipple twice daily for 14 days.

## 2017-05-20 LAB — HGB A1C W/O EAG: Hgb A1c MFr Bld: 11.4 % — ABNORMAL HIGH (ref 4.8–5.6)

## 2017-08-20 IMAGING — CR DG LUMBAR SPINE COMPLETE 4+V
5 series · 5 of 5 positions shown · non-contrast
Comparison: None.

CLINICAL DATA: Fall on [REDACTED], persistent low back pain

EXAM:
LUMBAR SPINE - COMPLETE 4+ VIEW

[view not recorded (1 of 5)]
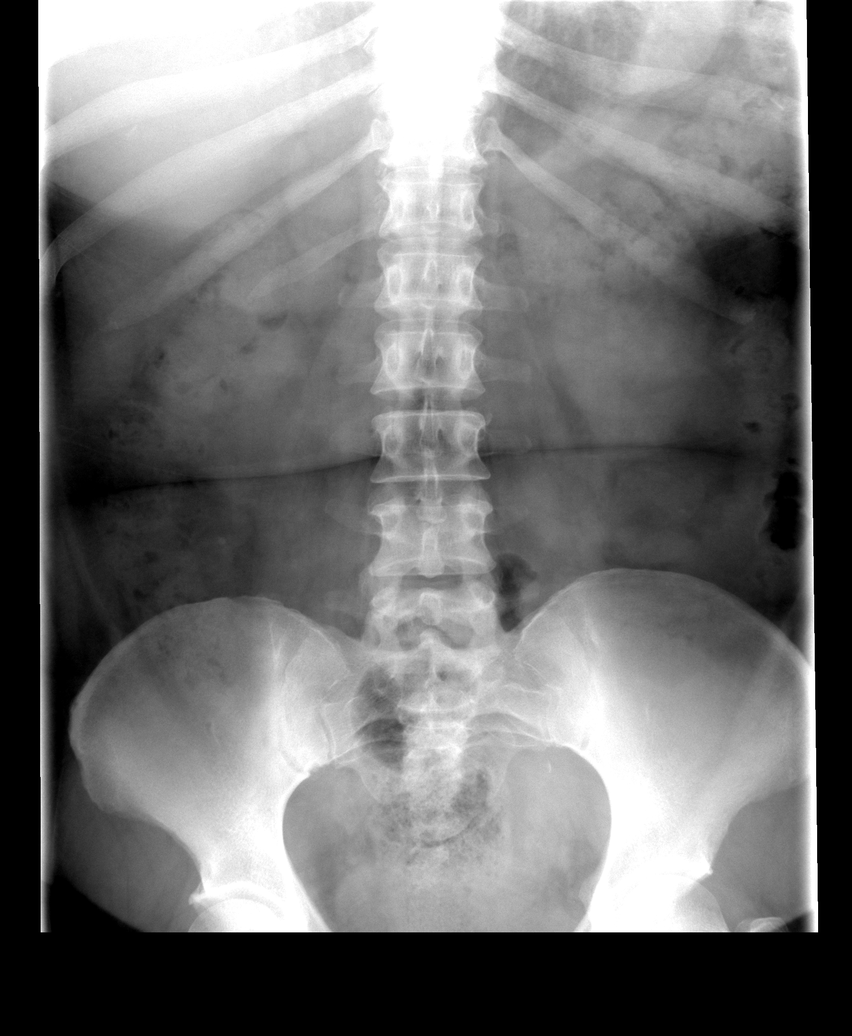

[view not recorded (2 of 5)]
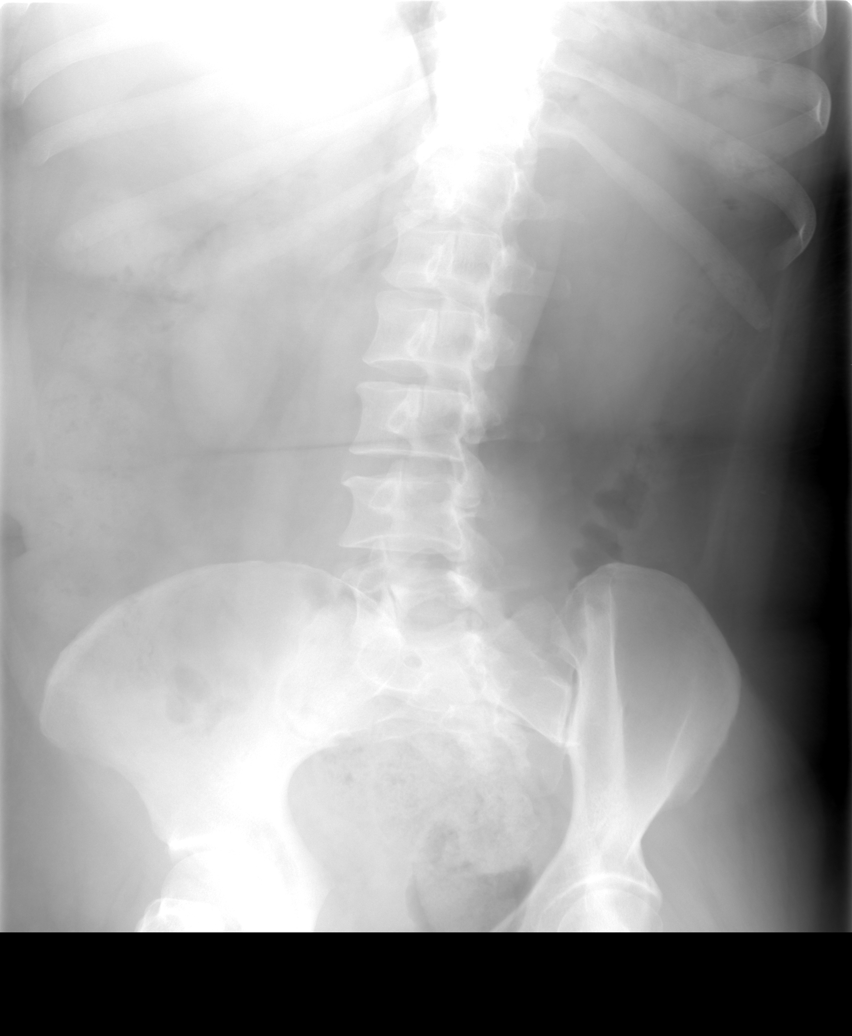

[view not recorded (3 of 5)]
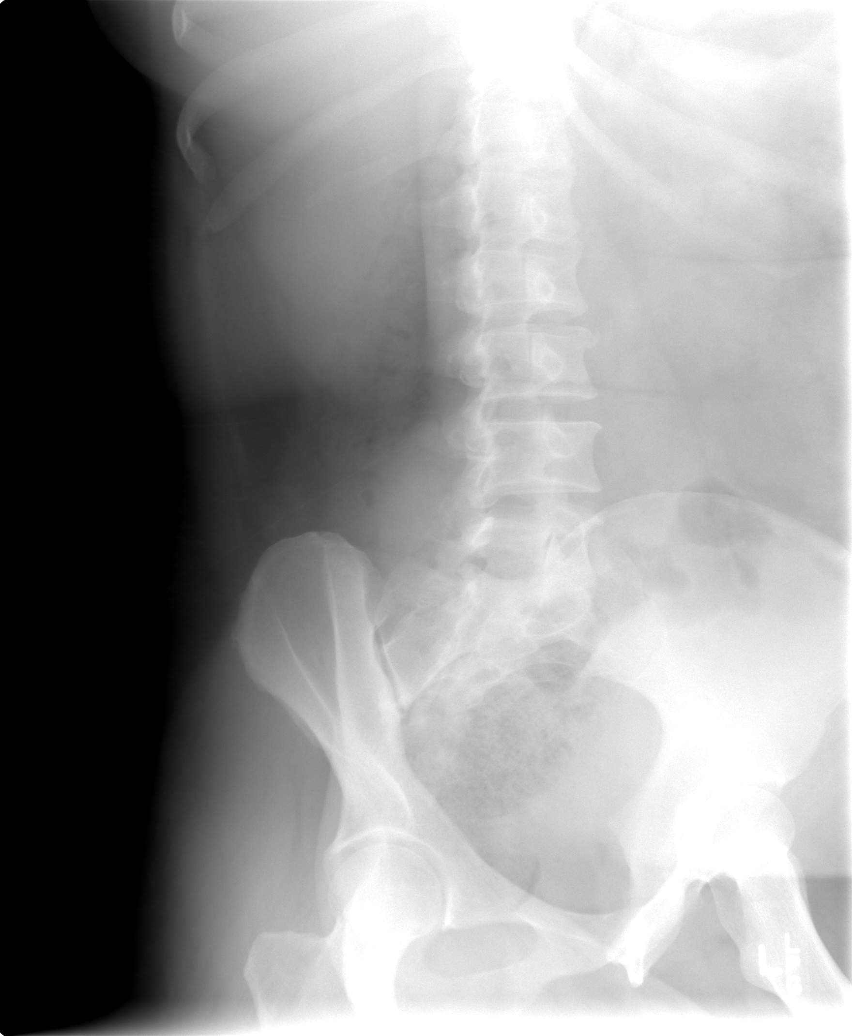

[view not recorded (4 of 5)]
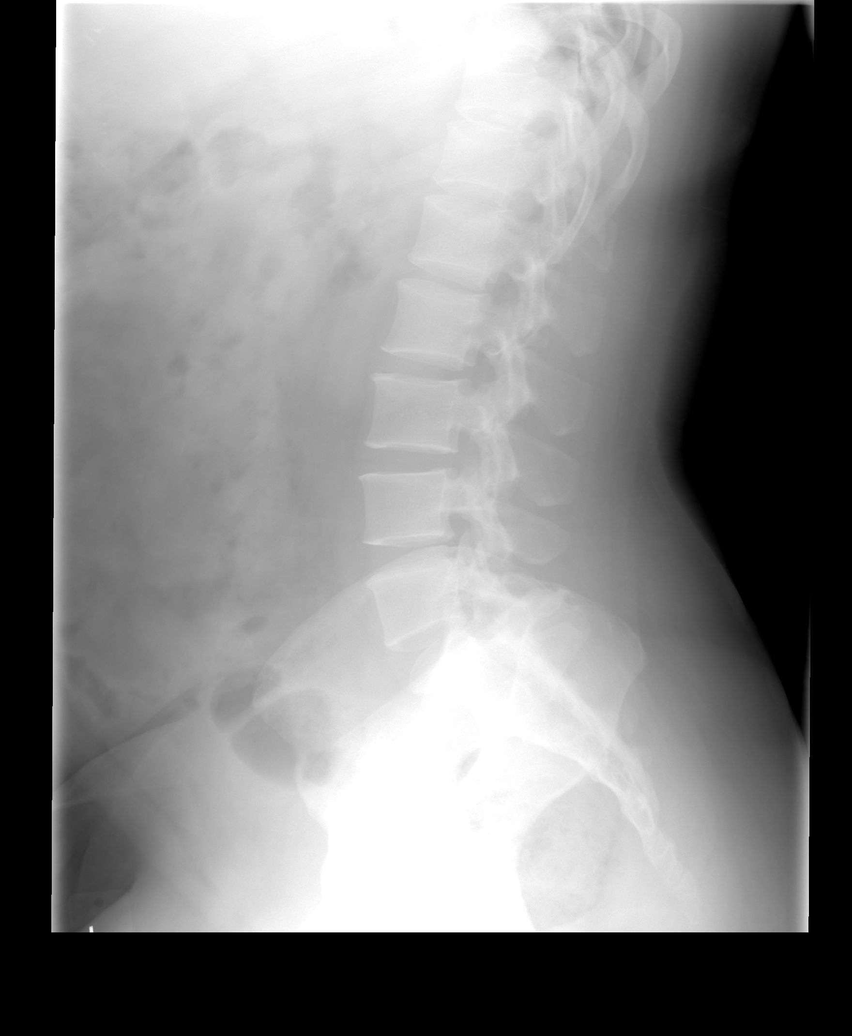

[view not recorded (5 of 5)]
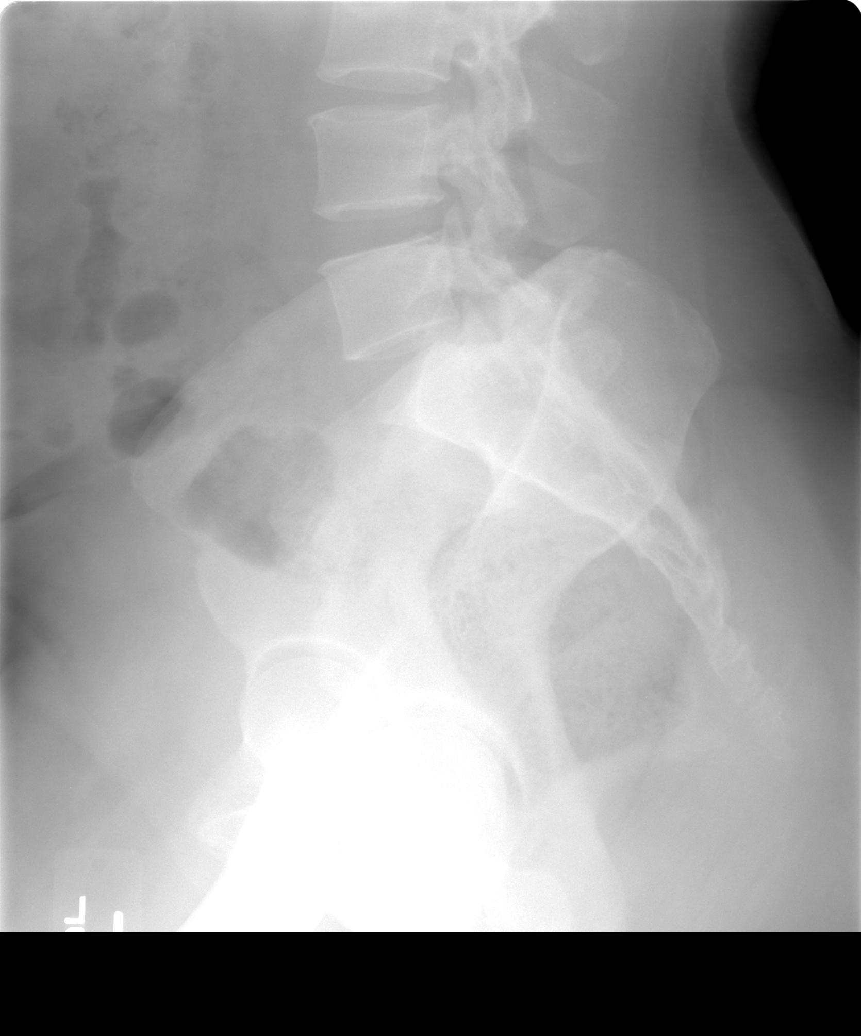

[5 of 5 positions shown; findings below may reference images not displayed]

FINDINGS: Five lumbar-type vertebral bodies.

Normal lumbar lordosis.

No evidence of fracture or dislocation. Vertebral body heights and
intervertebral disc spaces are maintained.

Visualized bony pelvis appears intact.

Bilobed hip joint spaces are preserved.
IMPRESSION: Negative.

## 2017-08-22 ENCOUNTER — Other Ambulatory Visit: Payer: Self-pay

## 2017-08-25 ENCOUNTER — Ambulatory Visit: Payer: Self-pay | Admitting: Internal Medicine

## 2017-09-01 ENCOUNTER — Ambulatory Visit: Payer: Self-pay | Admitting: Internal Medicine

## 2018-12-09 ENCOUNTER — Other Ambulatory Visit: Payer: Self-pay

## 2018-12-09 ENCOUNTER — Encounter (HOSPITAL_COMMUNITY): Payer: Self-pay | Admitting: *Deleted

## 2018-12-09 ENCOUNTER — Ambulatory Visit (HOSPITAL_COMMUNITY)
Admission: EM | Admit: 2018-12-09 | Discharge: 2018-12-09 | Disposition: A | Discharge status: Home / Self Care | Payer: Self-pay | Attending: Family Medicine | Admitting: Family Medicine

## 2018-12-09 DIAGNOSIS — L0291 Cutaneous abscess, unspecified: Secondary | ICD-10-CM

## 2018-12-09 HISTORY — DX: Cutaneous abscess, unspecified: L02.91

## 2018-12-09 MED ORDER — DOXYCYCLINE HYCLATE 100 MG PO TABS: 100.0000 mg | ORAL_TABLET | Freq: Two times a day (BID) | ORAL | 0 refills | Status: DC

## 2018-12-09 NOTE — ED Provider Notes (Signed)
[facility]    CSN: [id number] Arrival date & time: 12/09/18  1026      History   Chief Complaint Chief Complaint  Patient presents with   Abscess    HPI Annette Wolfe is a 43 y.o. female.   Is a 43 year old diabetic woman with a tender left areola.  She is an established patient here at [facility] urgent care.  Patient's had a problem with controlling her diabetes because of stress, but she feels like she is doing better now.  Her last A1c was 8.0, down from 11.  Patient has had a abscess that required drainage on the right in the past.  The new problem is on the left side and has been going on about a week, but really became quite tender on Friday afternoon.  She has had no fever and no drainage from the area.     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 No pertinent surgical history.  OB History    Gravida  1   Para      Term      Preterm      AB      Living  1     SAB      TAB      Ectopic      Multiple      Live Births  1            Home Medications    Prior to Admission medications   Medication Sig Start Date End Date Taking? Authorizing Provider  glipiZIDE (GLUCOTROL) 10 MG tablet 1 tab by mouth  twice daily with meals 02/17/17  Yes Mack Hook, MD  Ibuprofen (ADVIL) 200 MG CAPS Take by mouth 4 (four) times daily as needed.   Yes [provider]  lisinopril (PRINIVIL,ZESTRIL) 20 MG tablet Take 1 tablet (20 mg total) by mouth daily. 02/17/17  Yes Mack Hook, MD  metFORMIN (GLUCOPHAGE-XR) 500 MG 24 hr tablet 2 tabs by mouth twice daily with meals 02/17/17  Yes Mack Hook, MD  doxycycline (VIBRA-TABS) 100 MG tablet Take 1 tablet (100 mg total) by mouth 2 (two) times daily. 12/09/18   Robyn Haber, MD  fluconazole (DIFLUCAN) 150 MG tablet 1 tab by mouth daily for 3 days 05/19/17   Mack Hook, MD  glucose blood (AGAMATRIX PRESTO TEST) test strip Check sugars twice daily before meals Patient not taking: Reported on 01/20/2017 12/27/16   Mack Hook, MD    Family History Family History  Problem Relation Age of Onset   Hypertension Mother    Diabetes Mother    Hypertension Father    Diabetes Father     Social History Social History   Tobacco Use   Smoking status: Never Smoker   Smokeless tobacco: Never  Used  Substance Use Topics   Alcohol use: No   Drug use: No     Allergies   Patient has no known allergies.   Review of Systems Review of Systems  Skin: Positive for color change.  All other systems reviewed and are negative.    Physical Exam Triage Vital Signs ED Triage Vitals  Enc Vitals Group     BP 12/09/18 1049 137/77     Pulse Rate 12/09/18 1049 79     Resp 12/09/18 1049 16     Temp 12/09/18 1049 98.1 F (36.7 C)     Temp Source 12/09/18 1049 Other     SpO2 12/09/18 1049 99 %     Weight --      Height --      Head Circumference --      Peak Flow --      Pain Score 12/09/18 1051 4     Pain Loc --      Pain Edu? --      Excl. in [place]? --    No data found.  Updated Vital Signs BP 137/77    Pulse 79    Temp 98.1 F (36.7 C) (Other (Comment))    Resp 16    LMP 11/25/2018 (Approximate)    SpO2 99%     Physical Exam Vitals signs and nursing note reviewed.  Constitutional:      Appearance: Normal appearance.  Eyes:     Conjunctiva/sclera: Conjunctivae normal.  Cardiovascular:     Rate and Rhythm: Normal rate.  Pulmonary:     Effort: Pulmonary effort is normal.  Musculoskeletal: Normal range of motion.  Skin:    General: Skin is warm and dry.     Findings: Erythema present.     Comments: The superior border of the left areola is reddened and there is some mild induration under the entire areola.  There is no fluctuance.  Neurological:     General: No focal deficit present.     Mental Status: She is alert and oriented to person, place, and time.  Psychiatric:        Mood and Affect: Mood normal.        Behavior: Behavior normal.        Thought Content: Thought content normal.        Judgment: Judgment normal.      UC Treatments / Results  Labs (all labs ordered are listed, but only abnormal results are displayed) Labs Reviewed - No data to display  EKG   Radiology No results found.  Procedures Procedures (including critical care time)  Medications Ordered in UC Medications - No data to display  Initial Impression / Assessment and Plan / UC Course  I have reviewed the triage vital signs and the nursing notes.  Pertinent labs & imaging results that were available during my care of the patient were reviewed by me and considered in my medical decision making (see chart for details).    Final Clinical Impressions(s) / UC Diagnoses   Final diagnoses:  Abscess   Discharge Instructions   None    ED Prescriptions    Medication Sig Dispense Auth. Provider   doxycycline (VIBRA-TABS) 100 MG tablet Take 1 tablet (100 mg total) by mouth 2 (two) times daily. 20 tablet Robyn Haber, MD     I have reviewed the PDMP during this encounter.   Robyn Haber, MD 12/09/18 1109

## 2018-12-09 NOTE — ED Triage Notes (Addendum)
C/O abscess to left breast x 1 week.  Denies any fevers.  Has had in past.

## 2019-03-11 ENCOUNTER — Other Ambulatory Visit: Payer: Self-pay

## 2019-03-11 DIAGNOSIS — Z20822 Contact with and (suspected) exposure to covid-19: Secondary | ICD-10-CM

## 2019-03-12 LAB — NOVEL CORONAVIRUS, NAA: SARS-CoV-2, NAA: NOT DETECTED

## 2019-10-01 ENCOUNTER — Ambulatory Visit (HOSPITAL_COMMUNITY)
Admission: EM | Admit: 2019-10-01 | Discharge: 2019-10-01 | Disposition: A | Discharge status: Home / Self Care | Payer: Self-pay

## 2019-10-01 ENCOUNTER — Encounter (HOSPITAL_COMMUNITY): Payer: Self-pay

## 2019-10-01 DIAGNOSIS — R238 Other skin changes: Secondary | ICD-10-CM

## 2019-10-01 DIAGNOSIS — B3731 Acute candidiasis of vulva and vagina: Secondary | ICD-10-CM

## 2019-10-01 DIAGNOSIS — B373 Candidiasis of vulva and vagina: Secondary | ICD-10-CM

## 2019-10-01 DIAGNOSIS — Z3202 Encounter for pregnancy test, result negative: Secondary | ICD-10-CM

## 2019-10-01 DIAGNOSIS — R300 Dysuria: Secondary | ICD-10-CM

## 2019-10-01 LAB — POCT URINALYSIS DIP (DEVICE)
Bilirubin Urine: NEGATIVE
Glucose, UA: NEGATIVE mg/dL
Ketones, ur: NEGATIVE mg/dL
Leukocytes,Ua: NEGATIVE
Nitrite: NEGATIVE
Protein, ur: NEGATIVE mg/dL
Specific Gravity, Urine: 1.015 (ref 1.005–1.030)
Urobilinogen, UA: 0.2 mg/dL (ref 0.0–1.0)
pH: 5.5 (ref 5.0–8.0)

## 2019-10-01 LAB — POC URINE PREG, ED: Preg Test, Ur: NEGATIVE

## 2019-10-01 MED ORDER — FLUCONAZOLE 150 MG PO TABS: 150.0000 mg | ORAL_TABLET | Freq: Once | ORAL | 0 refills | Status: AC

## 2019-10-01 MED ORDER — HYDROCORTISONE ACETATE 25 MG RE SUPP: 25.0000 mg | Freq: Two times a day (BID) | RECTAL | 1 refills | Status: AC | PRN

## 2019-10-01 NOTE — ED Triage Notes (Signed)
Pt c/o dysuria sx for approx 3-4 weeks, hemorrhoids and vaginal irritation that has been chronic and now exacerbated the past few days. Also reports white discharge from vagina and itching.  Also c/o intermittent back pain; right nipple irritation/itching and inflammation.  Denies n/v/d, fever, chills.

## 2019-10-01 NOTE — ED Provider Notes (Signed)
[facility]    CSN: [id number] Arrival date & time: 10/01/19  0263      History   Chief Complaint Chief Complaint  Patient presents with  . Dysuria    HPI Annette Wolfe is a 44 y.o. female with past medical history of hypertension and diabetes presents to urgent care today with complaints of vaginal and rectal irritation.  History taken via Spanish interpreter.  Patient reports rectal and vaginal itching over the past 3 to 4 weeks unrelieved with hydrocortisone cream.  Patient states she began noticing thick white discharge from vagina.  Patient also describes skin breakdown around right nipple.  She has extensive history of issues with skin in this area including chronic mastitis followed in the past by her PCP.  She has had mammograms and skin biopsies in the past that have been negative for cancerous lesions.  Patient denies any recent headache, dizziness, chest pain, shortness of breath, abdominal pain, pelvic pain, N/V/D.    Past Medical History:  Diagnosis Date  . Abscess   . Diabetes mellitus 2014  . Hypertension     Patient Active Problem List   Diagnosis Date Noted  . Essential hypertension 12/27/2016  . Diabetes mellitus ([place]) 03/21/2012  . PAP SMEAR, LGSIL, ABNORMAL 01/22/2009  . HYPERCHOLESTEROLEMIA 01/15/2009  . RENAL CALCULUS, LEFT 01/15/2009  . OLIGOMENORRHEA 01/15/2009  . KERATOSIS PILARIS 01/15/2009  . OBESITY 06/05/2008  . CARPAL TUNNEL SYNDROME, RIGHT 06/05/2008  . HYPERGLYCEMIA 04/16/2008  . NECK PAIN, CHRONIC 04/08/2008  . FATIGUE 04/08/2008  . 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 FIBROIDS, UTERUS 04/06/2007  . CYSTITIS 04/06/2007  . OVARIAN MASS 12/28/2006  . PELVIC PAIN, LEFT 12/28/2006  . ALLERGIC RHINITIS 11/02/2006    History reviewed. No pertinent surgical history.  OB History    Gravida  1   Para      Term      Preterm      AB      Living  1     SAB      TAB      Ectopic      Multiple        Live Births  1            Home Medications    Prior to Admission medications   Medication Sig Start Date End Date Taking? Authorizing Provider  glipiZIDE (GLUCOTROL) 10 MG tablet 1 tab by mouth twice daily with meals 02/17/17  Yes Mack Hook, MD  lisinopril (PRINIVIL,ZESTRIL) 20 MG tablet Take 1 tablet (20 mg total) by mouth daily. 02/17/17  Yes Mack Hook, MD  Loratadine 10 MG CAPS loratadine 10 mg capsule  Take 10 mg every day by oral route.   Yes [provider]  metFORMIN (GLUCOPHAGE-XR) 500 MG 24 hr tablet 2 tabs by mouth twice daily with meals 02/17/17  Yes Mack Hook, MD  doxycycline (VIBRA-TABS) 100 MG tablet Take 1 tablet (100 mg total) by mouth 2 (two) times daily. 12/09/18   Robyn Haber, MD  glucose blood (AGAMATRIX PRESTO TEST) test strip Check sugars twice daily before meals Patient not taking: Reported on 01/20/2017 12/27/16   Mack Hook, MD  hydrocortisone (ANUSOL-HC) 25 MG suppository Place 1 suppository (25 mg total) rectally 2 (two) times daily as needed for hemorrhoids or anal itching. 10/01/19 09/30/20  Rudolpho Sevin, NP  Ibuprofen (ADVIL) 200 MG CAPS Take by mouth 4 (four) times daily as needed.    [provider]    Family History  Family History  Problem Relation Age of Onset  . Hypertension Mother   . Diabetes Mother   . Hypertension Father   . Diabetes Father     Social History Social History   Tobacco Use  . Smoking status: Never Smoker  . Smokeless tobacco: Never Used  Vaping Use  . Vaping Use: Never used  Substance Use Topics  . Alcohol use: No  . Drug use: No     Allergies   Patient has no known allergies.   Review of Systems As stated in HPI otherwise negative   Physical Exam Triage Vital Signs ED Triage Vitals  Enc Vitals Group     BP 10/01/19 1921 128/72     Pulse Rate 10/01/19 1921 86     Resp 10/01/19 1921 18     Temp 10/01/19 1921 98.3 F (36.8 C)     Temp Source  10/01/19 1921 Oral     SpO2 10/01/19 1921 100 %     Weight --      Height --      Head Circumference --      Peak Flow --      Pain Score 10/01/19 1918 0     Pain Loc --      Pain Edu? --      Excl. in [place]? --    No data found.  Updated Vital Signs BP 128/72 (BP Location: Right Arm)   Pulse 86   Temp 98.3 F (36.8 C) (Oral)   Resp 18   LMP 09/24/2019 (Approximate)   SpO2 100%      Physical Exam Constitutional:      General: She is not in acute distress.    Appearance: Normal appearance. She is obese. She is not ill-appearing or toxic-appearing.  HENT:     Mouth/Throat:     Mouth: Mucous membranes are moist.  Abdominal:     General: Bowel sounds are normal. There is no distension.     Palpations: Abdomen is soft.     Tenderness: There is no abdominal tenderness. There is no right CVA tenderness, left CVA tenderness or guarding.  Genitourinary:    General: Normal vulva.     Vagina: Vaginal discharge present.     Rectum: Normal.     Comments: Normal external genitalia.  Speculum exam done revealing thick white discharge consistent with yeast.  Some mild erythema in perineum without any excoriation.  Rectal exam normal Skin:    Comments: Very mild skin breakdown on right nipple.  No bleeding, no drainage  Neurological:     Mental Status: She is alert.  Psychiatric:        Mood and Affect: Mood normal.        Behavior: Behavior normal.      UC Treatments / Results  Labs (all labs ordered are listed, but only abnormal results are displayed) Labs Reviewed  POCT URINALYSIS DIP (DEVICE) - Abnormal; Notable for the following components:      Result Value   Hgb urine dipstick TRACE (*)    All other components within normal limits  POC URINE PREG, ED    EKG   Radiology No results found.  Procedures Procedures (including critical care time)  Medications Ordered in UC Medications - No data to display  Initial Impression / Assessment and Plan / UC Course  I  have reviewed the triage vital signs and the nursing notes.  Pertinent labs & imaging results that were available during my care of the  patient were reviewed by me and considered in my medical decision making (see chart for details).  Vaginal yeast infection -Yeast present on pelvic exam. Appears to be affecting perineal region 2 which is likely the cause of patient's rectal irritation. No external hemorrhoids though cannot rule out internal hemorrhoids -Diflucan, hydrocortisone suppositories  Skin breakdown, right nipple -Very mild today on exam however patient has had significant history of skin breakdown and chronic mastitis to right breast. There is no evidence of infectious etiology on today's exam -She has been asked to seek follow-up with PCP or return here if condition worsens -She needs to reestablish primary care. She states she has a number to call and will do so upon leaving  Final Clinical Impressions(s) / UC Diagnoses   Final diagnoses:  Vaginal yeast infection  Skin breakdown     Discharge Instructions     Take medication as prescribed. You need to find a primary care physician to manage diabetes and other issues. Call the number that you already have. Return for any fever or increasing pain.     ED Prescriptions    Medication Sig Dispense Auth. Provider   fluconazole (DIFLUCAN) 150 MG tablet Take 1 tablet (150 mg total) by mouth once for 1 dose. 2 tablet Rudolpho Sevin, NP   hydrocortisone (ANUSOL-HC) 25 MG suppository Place 1 suppository (25 mg total) rectally 2 (two) times daily as needed for hemorrhoids or anal itching. 12 suppository Rudolpho Sevin, NP     PDMP not reviewed this encounter.   Rudolpho Sevin, NP 10/02/19 2209

## 2019-10-01 NOTE — Discharge Instructions (Addendum)
Take medication as prescribed. You need to find a primary care physician to manage diabetes and other issues. Call the number that you already have. Return for any fever or increasing pain.

## 2019-12-03 ENCOUNTER — Ambulatory Visit (INDEPENDENT_AMBULATORY_CARE_PROVIDER_SITE_OTHER): Payer: Self-pay | Admitting: Primary Care

## 2022-07-12 ENCOUNTER — Encounter (HOSPITAL_COMMUNITY): Payer: Self-pay

## 2022-11-19 ENCOUNTER — Encounter (HOSPITAL_COMMUNITY): Payer: Self-pay

## 2022-11-19 ENCOUNTER — Ambulatory Visit (HOSPITAL_COMMUNITY)
Admission: EM | Admit: 2022-11-19 | Discharge: 2022-11-19 | Disposition: A | Discharge status: Home / Self Care | Payer: Self-pay | Attending: Urgent Care | Admitting: Urgent Care

## 2022-11-19 DIAGNOSIS — B379 Candidiasis, unspecified: Secondary | ICD-10-CM

## 2022-11-19 DIAGNOSIS — R350 Frequency of micturition: Secondary | ICD-10-CM

## 2022-11-19 DIAGNOSIS — L290 Pruritus ani: Secondary | ICD-10-CM

## 2022-11-19 DIAGNOSIS — E1165 Type 2 diabetes mellitus with hyperglycemia: Secondary | ICD-10-CM

## 2022-11-19 MED ORDER — FLUCONAZOLE 150 MG PO TABS: ORAL_TABLET | ORAL | 0 refills | Status: AC

## 2022-11-19 MED ORDER — NYSTATIN 100000 UNIT/GM EX POWD: 1.0000 | Freq: Two times a day (BID) | CUTANEOUS | 0 refills | Status: AC

## 2022-11-19 NOTE — ED Provider Notes (Signed)
[facility]    CSN: [id number] Arrival date & time: 11/19/22  1625      History   Chief Complaint Chief Complaint  Patient presents with   Hemorrhoids    HPI Annette Wolfe is a 47 y.o. female.   Patient presents today with concerns of anal irritation and itching, in combination with vaginal itching and irritation.  She states has been present for the past 8 weeks.  She saw her PCP and was prescribed hemorrhoid suppositories and creams.  She states she used the medication but feels it was ineffective.  Patient denies constipation.  She denies blood in her stool.  She denies straining or excessive heavy lifting.  Patient is a diabetic, states that her sugars have been running in the 250+ range.  She states they are hard to control.  She currently is taking 1500 mg of metformin daily in combination with 10 mg of glipizide.  Patient also is concerned that she has frequent urination.     Past Medical History:  Diagnosis Date   Abscess    Arm fracture, right    Diabetes mellitus 2014   Hypertension    Miscarriage    Seizure [facility])     Patient Active Problem List   Diagnosis Date Noted   Essential hypertension 12/27/2016   Diabetes mellitus (HCC) 03/21/2012   PAP SMEAR, LGSIL, ABNORMAL 01/22/2009   HYPERCHOLESTEROLEMIA 01/15/2009   RENAL CALCULUS, LEFT 01/15/2009   OLIGOMENORRHEA 01/15/2009   KERATOSIS PILARIS 01/15/2009   OBESITY 06/05/2008   CARPAL TUNNEL SYNDROME, RIGHT 06/05/2008   HYPERGLYCEMIA 04/16/2008   NECK PAIN, CHRONIC 04/08/2008   FATIGUE 04/08/2008   DYSPHAGIA, 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 No pertinent surgical history.  OB History     Gravida  1   Para  0   Term  0   Preterm  0   AB  0   Living         SAB  0   IAB  0   Ectopic  0   Multiple       Live Births               Home Medications    Prior to Admission medications   Medication Sig Start Date End Date Taking? Authorizing Provider  fluconazole (DIFLUCAN) 150 MG tablet Take one tab by mouth every three days until gone 11/19/22  Yes Isamu Trammel L, PA  nystatin (MYCOSTATIN/NYSTOP) powder Apply 1 Application topically 2 (two) times daily. 11/19/22  Yes Ayleen Mckinstry L, PA  glipiZIDE (GLUCOTROL) 10 MG tablet 1 tab by mouth twice daily with meals 02/17/17   Julieanne Manson, MD  glucose blood (AGAMATRIX PRESTO TEST) test strip Check sugars twice daily before meals Patient not taking: Reported on 01/20/2017 12/27/16   Julieanne Manson, MD  Ibuprofen (ADVIL) 200 MG CAPS Take by mouth 4 (four) times daily as needed.    [provider]  lisinopril (PRINIVIL,ZESTRIL) 20 MG tablet Take 1 tablet (20 mg total) by mouth daily. 02/17/17   Julieanne Manson, MD  Loratadine 10 MG CAPS loratadine 10 mg capsule  Take 10 mg every day by oral route.    [provider]  metFORMIN (GLUCOPHAGE) 500 MG tablet Take 1 tablet (500 mg total) by mouth 2 (two) times daily with a meal. 05/18/11 05/17/12  Kohut,  Jeannett Senior, MD  metFORMIN (GLUCOPHAGE-XR) 500 MG 24 hr tablet 2 tabs by mouth twice daily with meals 02/17/17   Julieanne Manson, MD    Family History Family History  Problem Relation Age of Onset   Hypertension Mother    Diabetes Mother    Hypertension Father    Diabetes Father     Social History Social History   Tobacco Use   Smoking status: Never   Smokeless tobacco: Never  Vaping Use   Vaping status: Never Used  Substance Use Topics   Alcohol use: No   Drug use: No     Allergies   Patient has no known allergies.   Review of Systems Review of Systems As per HPI  Physical Exam Triage Vital Signs ED Triage Vitals  Encounter Vitals Group     BP 11/19/22 1751 123/77     Systolic BP Percentile --      Diastolic BP Percentile --      Pulse Rate  11/19/22 1751 82     Resp 11/19/22 1751 16     Temp 11/19/22 1751 98.6 F (37 C)     Temp Source 11/19/22 1751 Oral     SpO2 11/19/22 1751 96 %     Weight 11/19/22 1751 155 lb (70.3 kg)     Height 11/19/22 1751 4\' 6"  (1.372 m)     Head Circumference --      Peak Flow --      Pain Score 11/19/22 1750 7     Pain Loc --      Pain Education --      Exclude from Growth Chart --    No data found.  Updated Vital Signs BP 123/77 (BP Location: Left Arm)   Pulse 82   Temp 98.6 F (37 C) (Oral)   Resp 16   Ht 4\' 6"  (1.372 m)   Wt 155 lb (70.3 kg)   LMP 11/05/2022 (Approximate)   SpO2 96%   BMI 37.37 kg/m   Visual Acuity Right Eye Distance:   Left Eye Distance:   Bilateral Distance:    Right Eye Near:   Left Eye Near:    Bilateral Near:     Physical Exam Vitals and nursing note reviewed. Chaperone present: deferred.  Constitutional:      General: She is not in acute distress.    Appearance: Normal appearance. She is obese. She is not ill-appearing, toxic-appearing or diaphoretic.  HENT:     Head: Normocephalic and atraumatic.     Mouth/Throat:     Mouth: Mucous membranes are moist.  Eyes:     General: No scleral icterus.       Right eye: No discharge.        Left eye: No discharge.     Extraocular Movements: Extraocular movements intact.     Pupils: Pupils are equal, round, and reactive to light.  Cardiovascular:     Rate and Rhythm: Normal rate.  Pulmonary:     Effort: Pulmonary effort is normal. No respiratory distress.  Abdominal:     Palpations: Abdomen is soft.     Tenderness: There is no abdominal tenderness.  Genitourinary:    Pubic Area: Rash (significant candidiasis noted externally, extending to medial thighs and anus) present.     Labia:        Right: Rash present.        Left: Rash present.      Urethra: No urethral swelling.     Rectum: Normal. No  tenderness, anal fissure or external hemorrhoid.       Comments: Entire groin area was swollen,  irritated, dry. Significant yeast noted, moderately erythematous. Numerous areas of excoriation noted. Rectal exam does not reveal fissure or hemorrhoid. Musculoskeletal:     Cervical back: Normal range of motion.  Lymphadenopathy:     Cervical: No cervical adenopathy.     Lower Body: No right inguinal adenopathy. No left inguinal adenopathy.  Skin:    Findings: Rash present.  Neurological:     Mental Status: She is alert.      UC Treatments / Results  Labs (all labs ordered are listed, but only abnormal results are displayed) Labs Reviewed - No data to display  EKG   Radiology No results found.  Procedures Procedures (including critical care time)  Medications Ordered in UC Medications - No data to display  Initial Impression / Assessment and Plan / UC Course  I have reviewed the triage vital signs and the nursing notes.  Pertinent labs & imaging results that were available during my care of the patient were reviewed by me and considered in my medical decision making (see chart for details).     Yeast infection - likely secondary to uncontrolled DM. Will start PO diflucan given extent of skin involvement; every 3 days for 5 treatments. Will also do external nystatin. Pt understands that until her sugars are better controlled, the yeast will continue to come back. Uncontrolled DM - it appears that pt should be taking 2000mg  metformin and 20mg  glipizide daily per her Rx sig, however pt is only taking 1500mg  metformin and 10mg  glipizide. We discussed increasing her medication to aid in control. Pt does not have insurance and thus is not wanting additional DM meds at this time. Used to be on "the injection" which worked well for her, but she is now unable to afford. Frequency of urination - no dysuria. Likely secondary to DM. Follow up with PCP for further assesment Anal itching - due to yeast. This extends all the way to the anal area. There is no evidence of hemorrhoid or  fissure.    Final Clinical Impressions(s) / UC Diagnoses   Final diagnoses:  Yeast infection  Uncontrolled type 2 diabetes mellitus with hyperglycemia (HCC)  Frequent urination  Anal itching     Discharge Instructions      Tu azucar es alta, es la razon que tienes hongos vaginales. No podemos curarlo sin bajando tu azucar.  Toma una tableta de Glipizide 10mg  tres veces al dia - un con desayuno, un con almuerzo, y un con cena. Si no come, no toma la pastilla.  Con metformin, por favor toma dos pastillas por la manana y dos pastillas por la noche.  El nivel de Chief Financial Officer puede ser como 100-120. Llamar tu medico para  que vuelva a controlar tu nivel de Chief Financial Officer.  Toma una pastilla de Diflucan cada tres dias hasta qua terminas.  Use nistatina en polvo en el rea afectada tres veces al da.     ED Prescriptions     Medication Sig Dispense Auth. Provider   fluconazole (DIFLUCAN) 150 MG tablet Take one tab by mouth every three days until gone 5 tablet Jahlia Omura L, PA   nystatin (MYCOSTATIN/NYSTOP) powder Apply 1 Application topically 2 (two) times daily. 30 g Machele Deihl L, [place]      PDMP not reviewed this encounter.   Maretta Bees, [place] 11/20/22 1135

## 2022-11-19 NOTE — Discharge Instructions (Signed)
Tu azucar es alta, es la razon que tienes hongos vaginales. No podemos curarlo sin bajando tu azucar.  Toma una tableta de Glipizide 10mg  tres veces al dia - un con desayuno, un con almuerzo, y un con cena. Si no come, no toma la pastilla.  Con metformin, por favor toma dos pastillas por la manana y dos pastillas por la noche.  El nivel de Chief Financial Officer puede ser como 100-120. Llamar tu medico para  que vuelva a controlar tu nivel de Chief Financial Officer.  Toma una pastilla de Diflucan cada tres dias hasta qua terminas.  Use nistatina en polvo en el rea afectada tres veces al da.

## 2022-11-19 NOTE — ED Triage Notes (Signed)
Patient here today with c/o worsening hemorrhoids for 2 weeks now. She has had hemorrhoids for 5 years now. 5 years ago she was given a suppository with helped significantly but she does not remember the name of it. She has also noticed that she has had a yeast infection for about 2 weeks as well.

## 2024-02-09 ENCOUNTER — Telehealth: Payer: Self-pay

## 2024-02-12 ENCOUNTER — Other Ambulatory Visit: Payer: Self-pay

## 2024-02-12 DIAGNOSIS — Z1231 Encounter for screening mammogram for malignant neoplasm of breast: Secondary | ICD-10-CM

## 2024-04-04 ENCOUNTER — Ambulatory Visit: Payer: Self-pay
